# Patient Record
Sex: Male | Born: 1945 | Race: White | Hispanic: No | Marital: Married | Smoking: Never smoker
Health system: Southern US, Community
[De-identification: ages and names within clinical notes are randomized; demographics above are authoritative.]

## PROBLEM LIST (undated history)

## (undated) DIAGNOSIS — E785 Hyperlipidemia, unspecified: Secondary | ICD-10-CM

## (undated) DIAGNOSIS — K219 Gastro-esophageal reflux disease without esophagitis: Secondary | ICD-10-CM

## (undated) DIAGNOSIS — N200 Calculus of kidney: Secondary | ICD-10-CM

## (undated) DIAGNOSIS — M1990 Unspecified osteoarthritis, unspecified site: Secondary | ICD-10-CM

## (undated) DIAGNOSIS — R413 Other amnesia: Secondary | ICD-10-CM

## (undated) DIAGNOSIS — I2510 Atherosclerotic heart disease of native coronary artery without angina pectoris: Secondary | ICD-10-CM

## (undated) DIAGNOSIS — I10 Essential (primary) hypertension: Secondary | ICD-10-CM

## (undated) DIAGNOSIS — K635 Polyp of colon: Secondary | ICD-10-CM

## (undated) DIAGNOSIS — R51 Headache: Secondary | ICD-10-CM

## (undated) DIAGNOSIS — C4490 Unspecified malignant neoplasm of skin, unspecified: Secondary | ICD-10-CM

## (undated) DIAGNOSIS — N529 Male erectile dysfunction, unspecified: Secondary | ICD-10-CM

## (undated) DIAGNOSIS — R519 Headache, unspecified: Secondary | ICD-10-CM

## (undated) HISTORY — DX: Male erectile dysfunction, unspecified: N52.9

## (undated) HISTORY — PX: COLONOSCOPY: SHX174

## (undated) HISTORY — DX: Calculus of kidney: N20.0

## (undated) HISTORY — DX: Essential (primary) hypertension: I10

## (undated) HISTORY — PX: EYE SURGERY: SHX253

## (undated) HISTORY — PX: HERNIA REPAIR: SHX51

## (undated) HISTORY — DX: Unspecified osteoarthritis, unspecified site: M19.90

## (undated) HISTORY — DX: Polyp of colon: K63.5

## (undated) HISTORY — PX: CAROTID ENDARTERECTOMY: SUR193

## (undated) HISTORY — DX: Gastro-esophageal reflux disease without esophagitis: K21.9

## (undated) HISTORY — DX: Other amnesia: R41.3

## (undated) HISTORY — DX: Unspecified malignant neoplasm of skin, unspecified: C44.90

## (undated) HISTORY — PX: POLYPECTOMY: SHX149

## (undated) HISTORY — DX: Hyperlipidemia, unspecified: E78.5

## (undated) HISTORY — PX: BACK SURGERY: SHX140

## (undated) HISTORY — DX: Atherosclerotic heart disease of native coronary artery without angina pectoris: I25.10

## (undated) HISTORY — PX: CORONARY ANGIOPLASTY WITH STENT PLACEMENT: SHX49

## (undated) HISTORY — PX: TOTAL KNEE ARTHROPLASTY: SHX125

## (undated) HISTORY — PX: JOINT REPLACEMENT: SHX530

---
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
## 2003-04-11 ENCOUNTER — Encounter (INDEPENDENT_AMBULATORY_CARE_PROVIDER_SITE_OTHER): Payer: Self-pay | Admitting: *Deleted

## 2003-04-11 ENCOUNTER — Ambulatory Visit (HOSPITAL_COMMUNITY): Admission: RE | Admit: 2003-04-11 | Discharge: 2003-04-11 | Payer: Self-pay | Admitting: Gastroenterology

## 2006-12-20 ENCOUNTER — Encounter: Admission: RE | Admit: 2006-12-20 | Discharge: 2006-12-20 | Payer: Self-pay | Admitting: Internal Medicine

## 2008-06-25 ENCOUNTER — Inpatient Hospital Stay (HOSPITAL_COMMUNITY): Admission: RE | Admit: 2008-06-25 | Discharge: 2008-06-28 | Payer: Self-pay | Admitting: Orthopedic Surgery

## 2009-10-04 ENCOUNTER — Ambulatory Visit: Payer: Self-pay | Admitting: Cardiovascular Disease

## 2009-10-10 ENCOUNTER — Telehealth (INDEPENDENT_AMBULATORY_CARE_PROVIDER_SITE_OTHER): Payer: Self-pay

## 2009-10-14 ENCOUNTER — Encounter (HOSPITAL_COMMUNITY): Admission: RE | Admit: 2009-10-14 | Discharge: 2009-12-23 | Payer: Self-pay | Admitting: Cardiovascular Disease

## 2009-10-14 ENCOUNTER — Ambulatory Visit: Payer: Self-pay

## 2009-10-14 ENCOUNTER — Ambulatory Visit: Payer: Self-pay | Admitting: Internal Medicine

## 2009-10-14 ENCOUNTER — Encounter: Payer: Self-pay | Admitting: Internal Medicine

## 2009-11-12 ENCOUNTER — Ambulatory Visit: Payer: Self-pay | Admitting: Cardiovascular Disease

## 2010-02-18 NOTE — Assessment & Plan Note (Signed)
Summary: Cardiology Nuclear Testing  Nuclear Med Background Indications for Stress Test: Evaluation for Ischemia, Stent Patency   History: Myocardial Perfusion Study, Stents  History Comments: '03 Stent RCA. '06 MPS: NL.  Symptoms: Chest Pain    Nuclear Pre-Procedure Cardiac Risk Factors: Lipids Caffeine/Decaff Intake: None NPO After: 7:30 PM Lungs: clear IV 0.9% NS with Angio Cath: 22g     IV Site: R Antecubital IV Started by: Irean Hong, RN Chest Size (in) 46     Height (in): 72 Weight (lb): 222 BMI: 30.22  Nuclear Med Study 1 or 2 day study:  1 day     Stress Test Type:  Treadmill/Lexiscan Reading MD:  Dietrich Pates, MD     Referring MD:  P.Nahser Resting Radionuclide:  Technetium 28m Tetrofosmin     Resting Radionuclide Dose:  11 mCi  Stress Radionuclide:  Technetium 70m Tetrofosmin     Stress Radionuclide Dose:  33 mCi   Stress Protocol  Max Systolic BP: 171 mm Hg Lexiscan: 0.4 mg   Stress Test Technologist:  Milana Na, EMT-P     Nuclear Technologist:  Domenic Polite, CNMT  Rest Procedure  Myocardial perfusion imaging was performed at rest 45 minutes following the intravenous administration of Technetium 28m Tetrofosmin.  Stress Procedure  The patient received IV Lexiscan 0.4 mg over 15-seconds with concurrent low level exercise and then Technetium 23m Tetrofosmin was injected at 30-seconds while the patient continued walking one more minute.  There were no significant changes with Lexiscan.  Quantitative spect images were obtained after a 45 minute delay.  QPS Raw Data Images:  Soft tissue (diaphragm) underlies heart. Stress Images:  Thinning in the inferior wall (base, distal); inferolateral wall (base).  Otherwise normal perfusion. Rest Images:  No significant change from the stress images. Subtraction (SDS):  No evidence of ischemia. Transient Ischemic Dilatation:  1.0  (Normal <1.22)  Lung/Heart Ratio:  .27  (Normal <0.45)  Quantitative Gated  Spect Images QGS EDV:  113 ml QGS ESV:  39 ml QGS EF:  65 %   Overall Impression  Exercise Capacity: Lexiscan with no exercise. BP Response: Normal blood pressure response. Clinical Symptoms: No chest pain ECG Impression: No significant ST segment change suggestive of ischemia. Overall Impression: Normal stress nuclear study.

## 2010-02-18 NOTE — Progress Notes (Signed)
Summary: Nuc. Pre-Procedure  Phone Note Outgoing Call Call back at [facility] Phone [phone]   Call placed by: Irean Hong, RN,  October 10, 2009 3:05 PM Summary of Call: Reviewed information on Myoview Information Sheet (see scanned document for further details).  Spoke with patient's wife.     Nuclear Med Background Indications for Stress Test: Evaluation for Ischemia, Stent Patency   History: Myocardial Perfusion Study, Stents  History Comments: '03 Stent RCA. '06 MPS: NL.  Symptoms: Chest Pain    Nuclear Pre-Procedure Cardiac Risk Factors: Lipids

## 2010-04-02 ENCOUNTER — Ambulatory Visit (INDEPENDENT_AMBULATORY_CARE_PROVIDER_SITE_OTHER): Payer: BC Managed Care – PPO | Admitting: Cardiovascular Disease

## 2010-04-02 DIAGNOSIS — Z9861 Coronary angioplasty status: Secondary | ICD-10-CM

## 2010-04-02 DIAGNOSIS — I119 Hypertensive heart disease without heart failure: Secondary | ICD-10-CM

## 2010-04-11 ENCOUNTER — Encounter: Payer: Self-pay | Admitting: *Deleted

## 2010-04-11 ENCOUNTER — Telehealth: Payer: Self-pay | Admitting: *Deleted

## 2010-04-11 NOTE — Telephone Encounter (Signed)
Message copied by Erskine Squibb on Fri Apr 11, 2010 10:20 AM ------      Message from: Sheffield Slider      Created: Fri Apr 11, 2010  9:31 AM      Regarding: INCREASE LIPITOR      Contact: [phone]       CALL WIFE ABOUT THE LIPITOR.  DOES NOT AGREE IN INCREASE PLEASE REVIEW PREVIOUS BLOOD WORK PER WIFE.  CHART IN BOX.  932AM

## 2010-04-11 NOTE — Telephone Encounter (Signed)
WIFE CALLING STATING HER HUSBAND DOES NOT WANT TO INCREASE HIS LIPITOR TO 40MG  PER DR. Namon Cirri REQUEST ON RECENT BLOOD WORK.  STATES HE IS NOT COMFORTABLE DOING THIS.  INSTRUCTED PT TO CONTINUE LIPITOR 20MG  AND NIASPAN 1000MG  DAILY AND CONTINUE TO WATCH HIS DIET AND EXERCISE.

## 2010-04-11 NOTE — Telephone Encounter (Signed)
ERRONEOUS This encounter was created in error - please disregard. 

## 2010-04-28 LAB — SYNOVIAL CELL COUNT + DIFF, W/ CRYSTALS
Crystals, Fluid: NONE SEEN
Lymphocytes-Synovial Fld: 35 % — ABNORMAL HIGH (ref 0–20)
Monocyte-Macrophage-Synovial Fluid: 60 % (ref 50–90)
WBC, Synovial: 13 /mm3 (ref 0–200)

## 2010-04-28 LAB — CBC
HCT: 33 % — ABNORMAL LOW (ref 39.0–52.0)
HCT: 40.7 % (ref 39.0–52.0)
Hemoglobin: 10.5 g/dL — ABNORMAL LOW (ref 13.0–17.0)
Hemoglobin: 13.6 g/dL (ref 13.0–17.0)
MCHC: 33.3 g/dL (ref 30.0–36.0)
MCHC: 33.9 g/dL (ref 30.0–36.0)
MCHC: 34.2 g/dL (ref 30.0–36.0)
MCHC: 34.7 g/dL (ref 30.0–36.0)
MCV: 96.4 fL (ref 78.0–100.0)
MCV: 96.6 fL (ref 78.0–100.0)
Platelets: 153 10*3/uL (ref 150–400)
Platelets: 183 10*3/uL (ref 150–400)
RBC: 3.43 MIL/uL — ABNORMAL LOW (ref 4.22–5.81)
RBC: 3.51 MIL/uL — ABNORMAL LOW (ref 4.22–5.81)
RBC: 4.22 MIL/uL (ref 4.22–5.81)
RDW: 12.7 % (ref 11.5–15.5)
RDW: 12.8 % (ref 11.5–15.5)
RDW: 13 % (ref 11.5–15.5)
WBC: 8.2 10*3/uL (ref 4.0–10.5)
WBC: 8.9 10*3/uL (ref 4.0–10.5)

## 2010-04-28 LAB — COMPREHENSIVE METABOLIC PANEL
ALT: 34 U/L (ref 0–53)
Albumin: 3.8 g/dL (ref 3.5–5.2)
BUN: 14 mg/dL (ref 6–23)
Chloride: 106 mEq/L (ref 96–112)
Creatinine, Ser: 1.09 mg/dL (ref 0.4–1.5)
GFR calc Af Amer: >60 mL/min (ref >60)
Potassium: 4.8 mEq/L (ref 3.5–5.1)
Total Bilirubin: 0.9 mg/dL (ref 0.3–1.2)
Total Protein: 6.2 g/dL (ref 6.0–8.3)

## 2010-04-28 LAB — BASIC METABOLIC PANEL
Chloride: 103 mEq/L (ref 96–112)
Creatinine, Ser: 1.12 mg/dL (ref 0.4–1.5)
Creatinine, Ser: 1.27 mg/dL (ref 0.4–1.5)
GFR calc Af Amer: >60 mL/min (ref >60)
GFR calc non Af Amer: >60 mL/min (ref >60)
Glucose, Bld: 133 mg/dL — ABNORMAL HIGH (ref 70–99)
Potassium: 4 mEq/L (ref 3.5–5.1)
Sodium: 137 mEq/L (ref 135–145)

## 2010-04-28 LAB — PROTIME-INR
INR: 1.1 (ref 0.00–1.49)
INR: 1.6 — ABNORMAL HIGH (ref 0.00–1.49)
Prothrombin Time: 15 seconds (ref 11.6–15.2)
Prothrombin Time: 19.9 seconds — ABNORMAL HIGH (ref 11.6–15.2)
Prothrombin Time: 25.1 seconds — ABNORMAL HIGH (ref 11.6–15.2)

## 2010-04-28 LAB — URINALYSIS, ROUTINE W REFLEX MICROSCOPIC: Hgb urine dipstick: NEGATIVE

## 2010-04-28 LAB — APTT: aPTT: 27 seconds (ref 24–37)

## 2010-04-28 LAB — PATHOLOGIST SMEAR REVIEW

## 2010-04-28 LAB — TYPE AND SCREEN: ABO/RH(D): O POS

## 2010-04-28 LAB — ABO/RH: ABO/RH(D): O POS

## 2010-06-03 NOTE — Op Note (Signed)
[name], [name]                ACCOUNT NO.:  [id number]   MEDICAL RECORD NO.:  [id number]          PATIENT TYPE:  INP   LOCATION:  0005                         FACILITY:  [facility]   PHYSICIAN:  Ollen Gross, M.D.    DATE OF BIRTH:  07/25/1945   DATE OF PROCEDURE:  06/25/2008  DATE OF DISCHARGE:                               OPERATIVE REPORT   PREOPERATIVE DIAGNOSIS:  Osteoarthritis, left knee.   POSTOPERATIVE DIAGNOSIS:  Osteoarthritis, left knee.   PROCEDURE:  Left total knee arthroplasty.   SURGEON:  Ollen Gross, M.D.   ASSISTANT:  Alexzandrew L. Perkins, P.A.C.   ANESTHESIA:  General with postop Marcaine pain pump.   ESTIMATED BLOOD LOSS:  Minimal.   DRAINS:  None.   TOURNIQUET TIME:  32 minutes at 300 mmHg.   COMPLICATIONS:  None.   CONDITION:  Stable to recovery.   BRIEF CLINICAL NOTE:  [name] is a 65 year old male with end-stage  arthritis of the left knee with progressively worsening pain and  dysfunction.  He presents now for left total knee arthroplasty.   PROCEDURE IN DETAIL:  After successful administration of general  anesthetic a tourniquet was placed high on the left thigh and left lower  extremity prepped and draped in the usual sterile fashion.  The  extremity was wrapped in an Esmarch, knee flexed, tourniquet inflated to  300 mmHg.  A midline incision was made with a 10 blade through the  subcutaneous tissue to the level of the extensor mechanism.  A fresh  blade was used to make a medial parapatellar arthrotomy.  The soft  tissue on the proximal medial tibia was subperiosteally elevated to the  joint line with the knife into the semimembranosus bursa with a Cobb  elevator.  The soft tissue laterally was elevated with attention being  paid to avoid the patellar tendon on the tibial tubercle.  The patella  was subluxed laterally and knee flexed 90 degrees and the ACL and PCL  were removed.  A drill was used to create a starting hole in the  distal  femur and the canal was thoroughly irrigated.  The 5 degrees left valgus  alignment guide was placed and referencing off the posterior condyles  rotation was marked and the block pinned to remove 11 mm off the distal  femur.  The 11 mm was taken because of a preop flexion contracture.  The  distal femoral resection was made with an oscillating saw.  The sizing  block was placed and size 5 was most appropriate.  Rotation was marked  off the epicondylar axis.  The size 5 cutting block was placed and the  anterior, posterior and chamfer cuts were made.   The tibia was subluxed forward and menisci removed.  The extramedullary  tibial alignment guide was placed referencing proximally at the medial  aspect of the tibial tubercle and distally along the second metatarsal  axis and tibial crest.  The block was pinned to remove about 10 mm off  the non-deficient lateral side.  The tibial resection was made with an  oscillating saw.  The  size 5 was the most appropriate tibial component  and the proximal tibia was prepared with the modular drill and keel  punch for the size 5.  The femoral preparation was completed with the  intercondylar cut for the size 5.   The size 5 mobile bearing tibial trial, the size 5 posterior stabilized  femoral trial and the 10 mm posterior stabilized rotating platform  insert trial were placed.  With the 10, full extension was achieved with  excellent varus, valgus and anterior and posterior balance throughout  full range of motion.  The patella was everted and thickness measured to  be 27 mm.  A freehand resection was taken to 15 mm, the 41 template was  placed, lug holes were drilled, trial patella was placed and it tracks  normally.  Osteophytes were removed off the posterior femur with the  trial in place.  All trials were removed and the cut bone surfaces were  prepared with pulsatile lavage.  The cement was mixed and once ready for  implantation a size 5  mobile bearing tibial tray, a size 5 posterior  stabilized femur and 41 patella were cemented into place and the patella  was held with a clamp.  The trial 10 mm insert was placed and the knee  held in full extension and all extruded cement removed.  When the cement  was fully hardened then the permanent 10 mm posterior stabilized  rotating platform insert was placed into the tibial tray.  The wound was  copiously irrigated with saline solution and then FloSeal injected on  the posterior capsule, medial and lateral gutters and suprapatellar  area.  A moist sponge was placed and tourniquet released for a total  time of 32 minutes.  The sponge was held for 2 minutes then removed.  Minimal bleeding was encountered.  Bleeding that was encountered was  stopped with electrocautery.  The wound was then copiously irrigated  with saline solution and the arthrotomy closed with interrupted #1 PDS.  Flexion against gravity was 140 degrees.  Subcutaneous was closed with  interrupted 2-0 Vicryl and subcuticular running 4-0 Monocryl.  The  catheter for the Marcaine pain pump was placed the pump was initiated.  The incision was cleaned and dried and Steri-Strips and bulky sterile  dressing applied.  He was placed into a knee immobilizer, awakened and  transported to recovery in stable condition.      Ollen Gross, M.D.  Electronically Signed     FA/MEDQ  D:  06/25/2008  T:  06/25/2008  Job:  [id number]

## 2010-06-03 NOTE — Discharge Summary (Signed)
[name], [name]                ACCOUNT NO.:  [id number]   MEDICAL RECORD NO.:  [id number]          PATIENT TYPE:  INP   LOCATION:  1615                         FACILITY:  [facility]   PHYSICIAN:  Ollen Gross, M.D.    DATE OF BIRTH:  02-13-1945   DATE OF ADMISSION:  06/25/2008  DATE OF DISCHARGE:  06/28/2008                               DISCHARGE SUMMARY   ADMISSION DIAGNOSES:  1. Osteoarthritis of the left knee.  2. Hypertension.  3. Coronary arterial disease.  4. Hypercholesterolemia.  5. History of gout.   DISCHARGE DIAGNOSES:  1. Osteoarthritis of the left knee, status post left total knee      replacement arthroplasty.  2. Hypertension.  3. Coronary arterial disease.  4. Hypercholesterolemia.  5. History of gout.   PROCEDURE:  On June 25, 2008, left total knee.   SURGEON:  Ollen Gross, M.D.   ASSISTANT:  Alexzandrew L. Perkins, P.A.C.   ANESTHESIA:  General.   TOURNIQUET TIME:  Thirty-two minutes.   CONSULTS:  Courtesy visit, Gaspar Garbe, M.D.   BRIEF HISTORY:  [name] is a 65 year old male with end-stage  arthroscopy of the left knee, progressive worsening pain and  dysfunction, now presents for total knee arthroplasty.   LABORATORY DATA:  Preop CBC showed a hemoglobin of 13.6, hematocrit  40.7, white cell count 5.7, platelets 183, PT/INR 13.7 and 1.0, PTT 27.  Chem panel on admission all within normal limits.  Preop UA:  Cloudy,  but negative.  Serial CBCs were followed.  Hemoglobin dropped down to  11.4 and came back up to 11.6.  Last known H and H 10.5 and 30.9.  Serial pro-times followed per Coumadin protocol.  Last known PT/INR 25.1  and 2.1.  Serial BMETs were followed.  Electrolytes remained within  normal limits.   DIAGNOSTICS:  1. Two-view chest June 19, 2008:  No acute cardiopulmonary      abnormalities.  2. EKG June 19, 2008:  Sinus bradycardia, otherwise normal EKG      confirmed by Dr. Arvilla Meres.   HOSPITAL COURSE:  The  patient admitted to [facility] taken to  the OR and underwent the above stated procedure without complication.  The patient tolerated the procedure well.  Later transferred to the  recovery room on the orthopedic floor and started on PCA and p.o.  analgesics for pain control following surgery.  He was doing well on the  p.o. medications by day 1, so we discontinued the PCA.  He started  getting up out of bed.  He had excellent urinary output.  He had a  history of a gout flare earlier prior to his hospitalization, so we sent  off some fluid from his knee for crystals which was normal.  No crystals  were seen.  He actually did real well on day 1.  He walked about 170  feet.  By day 2, he was walking over 200 feet.  He had a courtesy visit  by Dr. Wylene Simmer who came by to see his patient.  He was doing well.  On  day 2,  we changed his dressing.  He had excellent urinary output.  He  was not tolerating the Percocet very well, so we switched him over to  Vicodin and he did well with that.  By day 3, he was doing well, meeting  his goals and discharged home.   DISPOSITION:  The patient was discharged home on June 28, 2008.   DISCHARGE MEDICATIONS:  1. Vicodin.  2. Robaxin.  3. Coumadin.   DIET:  Heart-healthy, low-cholesterol diet.   ACTIVITY:  He is total knee protocol.  Home health PT and home health  nursing.  Weightbearing as tolerated.  May start showering, however, do  not submerge incision under water.   FOLLOW UP:  Follow up with Dr. Lequita Halt 2 weeks from date of surgery.  Contact the office at [phone] to make appointment for follow up care at  the [facility].   CONDITION ON DISCHARGE:  Improving.      Alexzandrew L. Perkins, P.A.C.      Ollen Gross, M.D.  Electronically Signed    ALP/MEDQ  D:  06/28/2008  T:  06/28/2008  Job:  [id number]   cc:   Ollen Gross, M.D.  Fax: [phone]   Gaspar Garbe, M.D.  Fax: [phone]    Vesta Mixer, M.D.  Fax: [phone]

## 2010-06-03 NOTE — H&P (Signed)
NAMETESLA, BOCHICCHIO                ACCOUNT NO.:  [id number]   MEDICAL RECORD NO.:  [id number]          PATIENT TYPE:  INP   LOCATION:  0005                         FACILITY:  [facility]   PHYSICIAN:  Ollen Gross, M.D.    DATE OF BIRTH:  15-Jul-1945   DATE OF ADMISSION:  06/25/2008  DATE OF DISCHARGE:                              HISTORY & PHYSICAL   CHIEF COMPLAINT:  Left knee pain.   HISTORY OF THE PRESENT ILLNESS:  The patient is 65 year old male who has  seen by Dr. Lequita Halt for ongoing left knee pain that has been ongoing for  many years now and has progressively gotten worse.  He is at a point now  where he has bone-on-bone contact in the medial compartment and also  patellofemoral.  He has advanced arthritis and felt to be a good  candidate for surgery.  He has seen preoperatively by Dr. Elease Hashimoto and  also by Dr. Wylene Simmer and felt to be stable for surgery.   ALLERGIES:  NO KNOWN DRUG ALLERGIES.   CURRENT MEDICATIONS:  Celebrex, Niaspan, Toprol, simvastatin, aspirin,  multivitamin, triamterene/HCTZ, osteobiflex, Prilosec over-the-counter,  fish oil, calcium, saw-palmetto.   PAST MEDICAL HISTORY:  Hypertension, coronary arterial disease,  hypercholesterolemia, history of gout.   PAST SURGICAL HISTORY:  Back surgery, arthroscopic knee surgery, eye  surgery x2, pilonidal cyst removal and he has undergone cardiac  catheterization in 2003 which was done in [place] where he had three  stents placed.   FAMILY HISTORY:  Father with colon cancer.  Mother with Alzheimer's.   SOCIAL HISTORY:  Married, retired from [employer].  He chews tobacco two  to three times a week, occasional use of alcohol, one child, has three  steps entering his home, does have a living will, healthcare power of  attorney.   REVIEW OF SYSTEMS:  GENERAL:  No fevers, chills, night sweats.  NEUROLOGIC:  No seizures, syncope or paralysis.  RESPIRATORY:  No shortness of breath, productive cough or hemoptysis.  CARDIOVASCULAR:  No chest pain.  GI: No nausea, vomiting, diarrhea or constipation.  GU: No dysuria, hematuria or discharge.  MUSCULOSKELETAL:  Joint pain and back pain.   PHYSICAL EXAMINATION:  Vital signs:  Pulse 56, respirations 12, blood  pressure 158/100.  General: A 65 year old white male well-nourished,  well-developed, no acute distress.  He is alert and cooperative,  pleasant.  Good historian.  HEENT: Normocephalic, atraumatic.  Pupils  are equal, round and reactive.  Oropharynx clear.  EOMs intact.  Neck:  Supple.  Chest: Clear anterior posterior chest walls.  No rhonchi, rales  or wheezing.  Heart: Regular rate and rhythm.  No murmur.  Abdomen:  Soft, nontender.  Bowel sounds present.  Rectal, breasts and genitalia  not done and per present illness.  Extremities:  Left knee - no  effusion, varus malalignment or deformity.  Range of motion 5-125.  Moderate crepitus.   IMPRESSION:  Osteoarthritis, left knee.   PLAN:  The patient will be admitted to [facility] to the left  total knee replacement arthroplasty.  Surgery will be performed by Dr.  Homero Fellers Aluisio.      Alexzandrew L. Perkins, P.A.C.      Ollen Gross, M.D.  Electronically Signed    ALP/MEDQ  D:  06/24/2008  T:  06/25/2008  Job:  [id number]   cc:   Gaspar Garbe, M.D.  Fax: [phone]   Vesta Mixer, M.D.  Fax: [phone]

## 2010-06-06 NOTE — Op Note (Signed)
NAME:  DELSHAWN, [name]                          ACCOUNT NO.:  [id number]   MEDICAL RECORD NO.:  [id number]                   PATIENT TYPE:  AMB   LOCATION:  ENDO                                 FACILITY:  [facility]   PHYSICIAN:  Bernette Redbird, M.D.                DATE OF BIRTH:  Jun 05, 1945   DATE OF PROCEDURE:  04/11/2003  DATE OF DISCHARGE:                                 OPERATIVE REPORT   PROCEDURE PERFORMED:  Colonoscopy with polypectomy.   ENDOSCOPIST:  Florencia Reasons, M.D.   INDICATIONS FOR PROCEDURE:  Family history of colon cancer in his father,  who was diagnosed in his late 78s.  Colonoscopy in another city about 3-1/2  years ago apparently showed a small polyp.   FINDINGS:  Small polyp near the splenic flexure.   DESCRIPTION OF PROCEDURE:  The risks and rationale of the procedure had been  discussed with the patient, who provided written consent.  Sedation was  fentanyl  100 mcg and Versed 9 mg IV prior to and during the course of the  procedure without arrhythmias or desaturation.   The Olympus adult video colonoscope was advanced with moderate looping  around the colon to the cecum, turning the patient into the supine position  and using external abdominal compression to control looping.  Pullback was  then performed.  The quality of the prep was excellent and it is felt that  all areas were well seen.   There was a 3 to 4 mm sessile polyp in the distal transverse colon at about  60 cm from the external anal opening removed by snare technique with  complete hemostasis and no evidence of excessive cautery.  No other polyps  were seen and there was no evidence of cancer, colitis, vascular  malformations or diverticulosis.  Retroflexion in the rectum was  unremarkable.   The scope was removed from the patient, who tolerated the procedure well and  there were no apparent complications.   IMPRESSION:  1. Solitary small sessile polyp removed as described above from the  midcolon     (211.3).  2. Family history of colon cancer.  3. Prior history of small colon polyp removed in another city, histology     unknown.   PLAN:  Await pathology on the polyp.                                               Bernette Redbird, M.D.    RB/MEDQ  D:  04/11/2003  T:  04/12/2003  Job:  [id number]   cc:   Gaspar Garbe, M.D.  [address]  [place]  [place] [address]  Fax: [phone]

## 2010-09-18 ENCOUNTER — Ambulatory Visit (INDEPENDENT_AMBULATORY_CARE_PROVIDER_SITE_OTHER): Payer: BC Managed Care – PPO | Admitting: Cardiovascular Disease

## 2010-09-18 ENCOUNTER — Encounter: Payer: Self-pay | Admitting: *Deleted

## 2010-09-18 ENCOUNTER — Encounter: Payer: Self-pay | Admitting: Cardiovascular Disease

## 2010-09-18 DIAGNOSIS — I2510 Atherosclerotic heart disease of native coronary artery without angina pectoris: Secondary | ICD-10-CM

## 2010-09-18 DIAGNOSIS — I10 Essential (primary) hypertension: Secondary | ICD-10-CM | POA: Insufficient documentation

## 2010-09-18 HISTORY — DX: Atherosclerotic heart disease of native coronary artery without angina pectoris: I25.10

## 2010-09-18 MED ORDER — LOSARTAN POTASSIUM 25 MG PO TABS: 25.0000 mg | ORAL_TABLET | Freq: Every day | ORAL | Status: DC

## 2010-09-18 NOTE — Assessment & Plan Note (Addendum)
He brought in his blood pressure log. His readings are all in the 110 120 range. He's taking Micardis 40 mg on every other day basis. He still has of some occasional episodes of dizziness when he takes Micardis.  I suspect that the Micardis is actually little bit too potent for him. I think we will try losartan 25 mg a day which should be less potent and will be cheaper.  I will see him in 6 months - sooner if his BP increases.  He will continue to check it every day.

## 2010-09-18 NOTE — Progress Notes (Signed)
[name] Date of Birth  July 13, 1945 [facility] [address]     [address] [place], [place]  [address] [phone]  Fax  [phone]  History of Present Illness:  65 year old gentleman with a history of coronary artery disease. Also has a history of hypertension.  He's done very well since I last saw him 5 months ago. He denies any chest pain or shortness breath. He's had some episodes of dizziness. He decreased his Micardis to 40 mg every other day and that seems to be helping. He still has some occasional episodes of dizziness especially on the days that he takes his Micardis.  Current Outpatient Prescriptions  Medication Sig Dispense Refill  . aspirin 81 MG tablet Take 81 mg by mouth daily.        [name] atorvastatin (LIPITOR) 20 MG tablet Take 20 mg by mouth daily.        . Calcium Carb-Cholecalciferol (CALCIUM 1000 + D PO) Take 1 tablet by mouth daily.        . celecoxib (CELEBREX) 200 MG capsule Take 200 mg by mouth daily.        . Cinnamon 500 MG capsule Take 500 mg by mouth daily.        . Misc Natural Products (OSTEO BI-FLEX JOINT SHIELD) TABS Take 1 tablet by mouth daily.        . multivitamin (THERAGRAN) per tablet Take 1 tablet by mouth daily.        . niacin (NIASPAN) 1000 MG CR tablet Take 1,000 mg by mouth at bedtime.        . Omega-3 Fatty Acids (FISH OIL) 1200 MG CAPS Take 1 capsule by mouth 2 (two) times daily.        [name] omeprazole (PRILOSEC) 20 MG capsule Take 20 mg by mouth daily.        . Saw Palmetto, Serenoa repens, (SAW PALMETTO PO) Take 1 capsule by mouth daily.        [name] telmisartan (MICARDIS) 40 MG tablet Take 40 mg by mouth every other day.       . vitamin C (ASCORBIC ACID) 500 MG tablet Take 500 mg by mouth daily.           No Known Allergies  Past Medical History  Diagnosis Date  . Coronary artery disease     post PTCA and stent of the distal RCA. He has a 3.0 mm espress 2 stent deployed at 14 atmospheres  placed in [place], [place]            . Dyslipidemia   . Erectile dysfunction   . Chest pain   . Chest pain     Past Surgical History  Procedure Date  . Coronary angioplasty with stent placement     post PTCA and stent of the distal RCA. He has a 3.0 mm espress 2 stent deployed at 14 atmospheres placed in [place], [place]            . Total knee arthroplasty     left    History  Smoking status  . Never Smoker   Smokeless tobacco  . Never Used    History  Alcohol Use No    Family History  Problem Relation Age of Onset  . Aortic aneurysm Father   . Cancer Father     colon   . Coronary artery disease Mother     with CABG  . Alzheimer's disease Mother   .  Hypertension Brother     Reviw of Systems:  Reviewed in the HPI.  All other systems are negative.  Physical Exam: BP 118/66  Pulse 64  Ht 6\' 1"  (1.854 m)  Wt 208 lb (94.348 kg)  BMI 27.44 kg/m2 The patient is alert and oriented x 3.  The mood and affect are normal.   Skin: warm and dry.  Color is normal.    HEENT:   the sclera are nonicteric.  The mucous membranes are moist.  The carotids are 2+ without bruits.  There is no thyromegaly.  There is no JVD.    Lungs: clear.  The chest wall is non tender.    Heart: regular rate with a normal S1 and S2.  There are no murmurs, gallops, or rubs. The PMI is not displaced.     Abdomen: good bowel sounds.  There is no guarding or rebound.  There is no hepatosplenomegaly or tenderness.  There are no masses.   Extremities:  no clubbing, cyanosis, or edema.  The legs are without rashes.  The distal pulses are intact.   Neuro:  Cranial nerves II - XII are intact.  Motor and sensory functions are intact.    The gait is normal.   Assessment / Plan:

## 2011-03-12 ENCOUNTER — Encounter: Payer: Self-pay | Admitting: Cardiovascular Disease

## 2011-03-17 ENCOUNTER — Encounter: Payer: Self-pay | Admitting: Cardiovascular Disease

## 2011-03-17 ENCOUNTER — Ambulatory Visit (INDEPENDENT_AMBULATORY_CARE_PROVIDER_SITE_OTHER): Payer: BC Managed Care – PPO | Admitting: Cardiovascular Disease

## 2011-03-17 VITALS — BP 142/85 | HR 59 | Ht 72.0 in | Wt 215.8 lb

## 2011-03-17 DIAGNOSIS — I2510 Atherosclerotic heart disease of native coronary artery without angina pectoris: Secondary | ICD-10-CM

## 2011-03-17 DIAGNOSIS — I10 Essential (primary) hypertension: Secondary | ICD-10-CM

## 2011-03-17 DIAGNOSIS — E785 Hyperlipidemia, unspecified: Secondary | ICD-10-CM

## 2011-03-17 MED ORDER — NIACIN ER (ANTIHYPERLIPIDEMIC) 1000 MG PO TBCR: 1000.0000 mg | EXTENDED_RELEASE_TABLET | Freq: Every day | ORAL | Status: DC

## 2011-03-17 NOTE — Progress Notes (Signed)
[name] Date of Birth  03/10/1945 [facility]     [place] Office  [address]    [address]   [address] [place], [place]  [address]    [place], [place]  [address] [phone]  Fax  [phone]  [phone]  Fax [phone]  Problem list: 1. Coronary artery disease-status post PTCA and stenting of the distal right coronary artery. He has a 3.0 mm express 2 stent deployed at 14 atmospheres. This was performed in [place], [place] 2. Dyslipidemia 3. Left total knee replacement 4. Erectile dysfunction  History of Present Illness:  [name] is doing fairly well. He's not had any episodes of chest pain. He has had some orthostatic hypotension but this seems to be fairly well controlled. He's gained a little bit of weight over the winter months.  Current Outpatient Prescriptions on File Prior to Visit  Medication Sig Dispense Refill  . aspirin 81 MG tablet Take 81 mg by mouth daily.        [name] atorvastatin (LIPITOR) 20 MG tablet Take 20 mg by mouth daily.        . celecoxib (CELEBREX) 200 MG capsule Take 200 mg by mouth daily.        [name] losartan (COZAAR) 25 MG tablet Take 1 tablet (25 mg total) by mouth daily.  30 tablet  12  . Misc Natural Products (OSTEO BI-FLEX JOINT SHIELD) TABS Take 1 tablet by mouth daily.        . multivitamin (THERAGRAN) per tablet Take 1 tablet by mouth daily.        . niacin (NIASPAN) 1000 MG CR tablet Take 1,000 mg by mouth at bedtime.        . Omega-3 Fatty Acids (FISH OIL) 1200 MG CAPS Take 1 capsule by mouth 2 (two) times daily.        [name] omeprazole (PRILOSEC) 20 MG capsule Take 20 mg by mouth daily.        . Saw Palmetto, Serenoa repens, (SAW PALMETTO PO) Take 2 capsules by mouth daily.       . vitamin C (ASCORBIC ACID) 500 MG tablet Take 500 mg by mouth daily.          No Known Allergies  Past Medical History  Diagnosis Date  . Coronary artery disease     post PTCA and stent of the distal RCA. He has a 3.0 mm  espress 2 stent deployed at 14 atmospheres placed in [place], [place]            . Dyslipidemia   . Erectile dysfunction   . Chest pain   . Chest pain     Past Surgical History  Procedure Date  . Coronary angioplasty with stent placement     post PTCA and stent of the distal RCA. He has a 3.0 mm espress 2 stent deployed at 14 atmospheres placed in [place], [place]            . Total knee arthroplasty     left    History  Smoking status  . Never Smoker   Smokeless tobacco  . Never Used    History  Alcohol Use No    Family History  Problem Relation Age of Onset  . Aortic aneurysm Father   . Cancer Father     colon   . Coronary artery disease Mother     with CABG  . Alzheimer's disease Mother   . Hypertension Brother  Reviw of Systems:  Reviewed in the HPI.  All other systems are negative.  Physical Exam: Blood pressure 142/85, pulse 59, height 6' (1.829 m), weight 215 lb 12.8 oz (97.886 kg). General: Well developed, well nourished, in no acute distress.  Head: Normocephalic, atraumatic, sclera non-icteric, mucus membranes are moist,   Neck: Supple. Carotids are 2 + without bruits. No JVD  Lungs: Clear bilaterally to auscultation.  Heart: regular rate  With normal  S1 S2. No murmurs, gallops or rubs.  Abdomen: Soft, non-tender, non-distended with normal bowel sounds. No hepatomegaly. No rebound/guarding. No masses.  Msk:  Strength and tone are normal  Extremities: No clubbing or cyanosis. No edema.  Distal pedal pulses are 2+ and equal bilaterally.  Neuro: Alert and oriented X 3. Moves all extremities spontaneously.  Psych:  Responds to questions appropriately with a normal affect.  ECG: NSR, no ST or T wave changes  Assessment / Plan:

## 2011-03-17 NOTE — Patient Instructions (Signed)
Your physician wants you to follow-up in: 6 months  You will receive a reminder letter in the mail two months in advance. If you don't receive a letter, please call our office to schedule the follow-up appointment.   Your physician recommends that you return for a FASTING lipid profile: 6 month app.

## 2011-03-17 NOTE — Assessment & Plan Note (Signed)
[name] seems to be doing very well. He's not had any episodes of chest pain or shortness breath. He does have some occasional episodes of orthostatic hypotension.  We'll continue the same medications. He had his fasting lipids checked at his medical doctors office a week or so ago. I'll see him again in 6 months. We'll check fasting labs at that time.

## 2011-10-19 ENCOUNTER — Telehealth: Payer: Self-pay | Admitting: Cardiovascular Disease

## 2011-10-19 NOTE — Telephone Encounter (Signed)
Per last office visit with Dr. Melburn Popper in February 2013, pt is still on Losartan 25mg  daily.  [facility] [address]

## 2011-10-19 NOTE — Telephone Encounter (Signed)
losartin refill requested marked off med list, is he still to take

## 2011-11-09 ENCOUNTER — Encounter: Payer: Self-pay | Admitting: Cardiovascular Disease

## 2011-11-09 ENCOUNTER — Ambulatory Visit (INDEPENDENT_AMBULATORY_CARE_PROVIDER_SITE_OTHER): Payer: BC Managed Care – PPO | Admitting: Cardiovascular Disease

## 2011-11-09 VITALS — BP 138/84 | HR 58 | Ht 72.0 in | Wt 205.0 lb

## 2011-11-09 DIAGNOSIS — E785 Hyperlipidemia, unspecified: Secondary | ICD-10-CM

## 2011-11-09 DIAGNOSIS — I2510 Atherosclerotic heart disease of native coronary artery without angina pectoris: Secondary | ICD-10-CM

## 2011-11-09 DIAGNOSIS — R001 Bradycardia, unspecified: Secondary | ICD-10-CM | POA: Insufficient documentation

## 2011-11-09 DIAGNOSIS — I498 Other specified cardiac arrhythmias: Secondary | ICD-10-CM

## 2011-11-09 NOTE — Patient Instructions (Addendum)
Your physician wants you to follow-up in: 6 months  You will receive a reminder letter in the mail two months in advance. If you don't receive a letter, please call our office to schedule the follow-up appointment.  Your physician recommends that you continue on your current medications as directed. Please refer to the Current Medication list given to you today.   Please bring a copy of your last fasting labs

## 2011-11-09 NOTE — Assessment & Plan Note (Signed)
His baseline heart rate is  fairly slow. We performed a one minute step test.He was able to  Increase his heart rate from 58 up to 85-90.   He has no symptoms.  He mentioned pacemaker some day but I told him that there was nothing he could do to prevent that from occurring.  We'll continue to monitor him closely.

## 2011-11-09 NOTE — Progress Notes (Signed)
[name] Date of Birth  03/26/1945 [facility]     [place] Office  [address]    [address]   [address] [place], [place]  [address]    [place], [place]  [address] [phone]  Fax  [phone]  [phone]  Fax [phone]  Problem list: 1. Coronary artery disease-status post PTCA and stenting of the distal right coronary artery. He has a 3.0 mm express 2 stent deployed at 14 atmospheres. This was performed in [place], [place] 2. Dyslipidemia 3. Left total knee replacement 4. Erectile dysfunction  History of Present Illness:  [name] is doing fairly well. He's not had any episodes of chest pain. He has had some orthostatic hypotension but this seems to be fairly well controlled. He's gained a little bit of weight over the winter months.  [name]  has done well since I last saw him. He continues to have a couple of episodes of orthostatic hypotension.  He's never had episodes of syncope.  He was recently seen by his general medical Dr. He had an LDL particle number  Measured but we do not have the results available today.  He denies any chest pain or dyspnea.  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  Current Outpatient Prescriptions on File Prior to Visit  Medication Sig Dispense Refill  . aspirin 81 MG tablet Take 81 mg by mouth daily.        [name] atorvastatin (LIPITOR) 20 MG tablet Take 20 mg by mouth daily.        . celecoxib (CELEBREX) 200 MG capsule Take 200 mg by mouth daily.        . Misc Natural Products (OSTEO BI-FLEX JOINT SHIELD) TABS Take 1 tablet by mouth daily.        . multivitamin (THERAGRAN) per tablet Take 1 tablet by mouth daily.        . niacin (NIASPAN) 1000 MG CR tablet Take 1 tablet (1,000 mg total) by mouth at bedtime.  90 tablet  3  . Omega-3 Fatty Acids (FISH OIL) 1200 MG CAPS Take 1 capsule by mouth 2 (two) times daily.        [name] omeprazole (PRILOSEC) 20 MG capsule Take 20 mg by mouth daily.         . Saw Palmetto, Serenoa repens, (SAW PALMETTO PO) Take 2 capsules by mouth daily.       . vitamin C (ASCORBIC ACID) 500 MG tablet Take 500 mg by mouth daily.          No Known Allergies  Past Medical History  Diagnosis Date  . Coronary artery disease     post PTCA and stent of the distal RCA. He has a 3.0 mm espress 2 stent deployed at 14 atmospheres placed in [place], [place]            . Dyslipidemia   . Erectile dysfunction   . Chest pain   . Chest pain     Past Surgical History  Procedure Date  . Coronary angioplasty with stent placement     post PTCA and stent of the distal RCA. He has a 3.0 mm espress 2 stent deployed at 14 atmospheres placed in [place], [place]            . Total knee arthroplasty     left    History  Smoking status  . Never Smoker   Smokeless tobacco  . Never  Used    History  Alcohol Use No    Family History  Problem Relation Age of Onset  . Aortic aneurysm Father   . Cancer Father     colon   . Coronary artery disease Mother     with CABG  . Alzheimer's disease Mother   . Hypertension Brother     Reviw of Systems:  Reviewed in the HPI.  All other systems are negative.  Physical Exam: Blood pressure 138/84, pulse 58, height 6' (1.829 m), weight 205 lb (92.987 kg), SpO2 99.00%. General: Well developed, well nourished, in no acute distress.  Head: Normocephalic, atraumatic, sclera non-icteric, mucus membranes are moist,   Neck: Supple. Carotids are 2 + without bruits. No JVD  Lungs: Clear bilaterally to auscultation.  Heart: regular rate  With normal  S1 S2. No murmurs, gallops or rubs.  Abdomen: Soft, non-tender, non-distended with normal bowel sounds. No hepatomegaly. No rebound/guarding. No masses.  Msk:  Strength and tone are normal  Extremities: No clubbing or cyanosis. No edema.  Distal pedal pulses are 2+ and equal bilaterally.  Neuro: Alert and oriented X 3. Moves all extremities spontaneously.  Psych:  Responds  to questions appropriately with a normal affect.  ECG:  Assessment / Plan:

## 2011-11-09 NOTE — Assessment & Plan Note (Signed)
And he has done well since his stenting 10 years ago. He's not had any episodes of chest pain or shortness of breath. We'll continue the same medications.  He apparently had subcostal levels and an LDL particle number measured at his medical doctors office. He was given a recommendation to increase his Lipitor. He wanted to check with me about that before he raise the dose. I've asked him to fax those results. If his LDL particle number as above 1000 we will certainly want to increase his atorvastatin 40 mg a day.

## 2011-11-11 ENCOUNTER — Encounter: Payer: Self-pay | Admitting: Cardiovascular Disease

## 2012-02-18 ENCOUNTER — Other Ambulatory Visit: Payer: Self-pay | Admitting: *Deleted

## 2012-02-18 DIAGNOSIS — I2510 Atherosclerotic heart disease of native coronary artery without angina pectoris: Secondary | ICD-10-CM

## 2012-02-18 DIAGNOSIS — E785 Hyperlipidemia, unspecified: Secondary | ICD-10-CM

## 2012-02-18 MED ORDER — NIACIN ER (ANTIHYPERLIPIDEMIC) 1000 MG PO TBCR: 1000.0000 mg | EXTENDED_RELEASE_TABLET | Freq: Every day | ORAL | Status: DC

## 2012-02-18 NOTE — Telephone Encounter (Signed)
Fax Received. Refill Completed. Raelyn Racette Chowoe (R.M.A)   

## 2012-04-28 ENCOUNTER — Telehealth: Payer: Self-pay | Admitting: Cardiovascular Disease

## 2012-04-28 MED ORDER — LOSARTAN POTASSIUM 25 MG PO TABS: 25.0000 mg | ORAL_TABLET | Freq: Every day | ORAL | Status: DC

## 2012-04-28 NOTE — Telephone Encounter (Signed)
Fax Received. Refill Completed. [name] (R.M.A)   

## 2012-04-28 NOTE — Telephone Encounter (Signed)
PT'S WIFE CALLING RE RX FOR LOSARTIN, [facility] AND [facility] BEFORE [facility] CAN [facility]FI[facility] DUE TO OUR TURN AROUND TIME , NEEDS ASAP,   PLS CALL IF ANY PROBLEM [facility]

## 2012-05-17 ENCOUNTER — Ambulatory Visit (INDEPENDENT_AMBULATORY_CARE_PROVIDER_SITE_OTHER): Payer: BC Managed Care – PPO | Admitting: Cardiovascular Disease

## 2012-05-17 ENCOUNTER — Encounter: Payer: Self-pay | Admitting: Cardiovascular Disease

## 2012-05-17 VITALS — BP 140/84 | HR 62 | Ht 72.0 in | Wt 204.8 lb

## 2012-05-17 DIAGNOSIS — Z8249 Family history of ischemic heart disease and other diseases of the circulatory system: Secondary | ICD-10-CM

## 2012-05-17 DIAGNOSIS — R1900 Intra-abdominal and pelvic swelling, mass and lump, unspecified site: Secondary | ICD-10-CM

## 2012-05-17 DIAGNOSIS — E785 Hyperlipidemia, unspecified: Secondary | ICD-10-CM

## 2012-05-17 DIAGNOSIS — I2510 Atherosclerotic heart disease of native coronary artery without angina pectoris: Secondary | ICD-10-CM

## 2012-05-17 MED ORDER — LOSARTAN POTASSIUM 25 MG PO TABS: 25.0000 mg | ORAL_TABLET | Freq: Every day | ORAL | Status: DC

## 2012-05-17 NOTE — Assessment & Plan Note (Signed)
He has a pulsatile abdominal mass. His father died of an abdominal aortic aneurysm. It will be important for [place] to evaluate this further. We'll order an abdominal duplex scan to rule out abdominal aortic aneurysm.

## 2012-05-17 NOTE — Progress Notes (Signed)
[name] Date of Birth  04-03-1945 [facility]     [place] Office  [address]    [address]   [address] [place], [place]  [address]    [place], [place]  [address] [phone]  Fax  [phone]  [phone]  Fax [phone]  Problem list: 1. Coronary artery disease-status post PTCA and stenting of the distal right coronary artery. He has a 3.0 mm express 2 stent deployed at 14 atmospheres. This was performed in [place], [place] 2. Dyslipidemia 3. Left total knee replacement 4. Erectile dysfunction  History of Present Illness:  [name] is doing fairly well. He's not had any episodes of chest pain. He has had some orthostatic hypotension but this seems to be fairly well controlled. He's gained a little bit of weight over the winter months.  [name]  has done well since I last saw him. He continues to have a couple of episodes of orthostatic hypotension.  He's never had episodes of syncope.  He was recently seen by his general medical Dr. He had an LDL particle number  Measured but we do not have the results available today.  He denies any chest pain or dyspnea.  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  May 17, 2012:  [name] is doing well .  He has had labwork through Dr. Deneen Harts office.  His last LDL is 75 .  Dr. Wylene Simmer has suggested the Lipator / Zetia combination ( Liptruzet) his wife states that his memory has been getting a little worse. I suspect that Dr. Wylene Simmer  is trying to limit the Lipator dose and replace some of the atorvastatin with Zetia.    Current Outpatient Prescriptions on File Prior to Visit  Medication Sig Dispense Refill  . aspirin 81 MG tablet Take 81 mg by mouth daily.        [name] atorvastatin (LIPITOR) 20 MG tablet Take 20 mg by mouth daily.        . celecoxib (CELEBREX) 200 MG capsule Take 200 mg by mouth daily.        . Misc Natural Products (OSTEO BI-FLEX JOINT SHIELD) TABS Take 1 tablet  by mouth daily.        . multivitamin (THERAGRAN) per tablet Take 1 tablet by mouth daily.        . niacin (NIASPAN) 1000 MG CR tablet Take 1 tablet (1,000 mg total) by mouth at bedtime.  90 tablet  3  . Omega-3 Fatty Acids (FISH OIL) 1200 MG CAPS Take 1 capsule by mouth 2 (two) times daily.        [name] omeprazole (PRILOSEC) 20 MG capsule Take 20 mg by mouth daily.        . Saw Palmetto, Serenoa repens, (SAW PALMETTO PO) Take 2 capsules by mouth daily.       . vitamin C (ASCORBIC ACID) 500 MG tablet Take 500 mg by mouth daily.        [name] losartan (COZAAR) 25 MG tablet Take 1 tablet (25 mg total) by mouth daily.  30 tablet  5   No current facility-administered medications on file prior to visit.    No Known Allergies  Past Medical History  Diagnosis Date  . Coronary artery disease     post PTCA and stent of the distal RCA. He has a 3.0 mm espress 2 stent deployed at 14 atmospheres placed in [place], [place]            .  Dyslipidemia   . Erectile dysfunction   . Chest pain   . Chest pain     Past Surgical History  Procedure Laterality Date  . Coronary angioplasty with stent placement      post PTCA and stent of the distal RCA. He has a 3.0 mm espress 2 stent deployed at 14 atmospheres placed in [place], [place]            . Total knee arthroplasty      left    History  Smoking status  . Never Smoker   Smokeless tobacco  . Never Used    History  Alcohol Use No    Family History  Problem Relation Age of Onset  . Aortic aneurysm Father   . Cancer Father     colon   . Coronary artery disease Mother     with CABG  . Alzheimer's disease Mother   . Hypertension Brother     Reviw of Systems:  Reviewed in the HPI.  All other systems are negative.  Physical Exam: Blood pressure 140/84, pulse 62, height 6' (1.829 m), weight 204 lb 12.8 oz (92.897 kg). General: Well developed, well nourished, in no acute distress.  Head: Normocephalic, atraumatic, sclera non-icteric, mucus  membranes are moist,   Neck: Supple. Carotids are 2 + without bruits. No JVD  Lungs: Clear bilaterally to auscultation.  Heart: regular rate  With normal  S1 S2. No murmurs, gallops or rubs.  Abdomen: Soft, non-tender, non-distended with normal bowel sounds. No hepatomegaly. No rebound/guarding. No masses.  Msk:  Strength and tone are normal  Extremities: No clubbing or cyanosis. No edema.  Distal pedal pulses are 2+ and equal bilaterally.  Neuro: Alert and oriented X 3. Moves all extremities spontaneously.  Psych:  Responds to questions appropriately with a normal affect.  ECG: 05/17/2012: Normal sinus rhythm at 62 beats a minute. He has no ST or T wave changes. Assessment / Plan:

## 2012-05-17 NOTE — Patient Instructions (Addendum)
Schedule abdominal ultra sound to evaluate for a abdominal aortic aneurysm    Your physician wants you to follow-up in: 6 monthsl receive a reminder letter in the mail two months in advance. If you don't receive a letter, please call our office to schedule the follow-up appointment.[name]

## 2012-05-17 NOTE — Assessment & Plan Note (Signed)
His most recent LDL is 75. This is quite acceptable. It sounds like his medical doctor is trying to minus his exposure to atorvastatin and also start Zetia.  This may be for memory improvement.

## 2012-05-19 ENCOUNTER — Other Ambulatory Visit: Payer: Self-pay | Admitting: Cardiovascular Disease

## 2012-05-19 ENCOUNTER — Ambulatory Visit (HOSPITAL_COMMUNITY)
Admission: RE | Admit: 2012-05-19 | Discharge: 2012-05-19 | Disposition: A | Discharge status: Home / Self Care | Payer: BC Managed Care – PPO | Source: Ambulatory Visit | Attending: Cardiovascular Disease | Admitting: Cardiovascular Disease

## 2012-05-19 DIAGNOSIS — Z8249 Family history of ischemic heart disease and other diseases of the circulatory system: Secondary | ICD-10-CM

## 2012-05-19 DIAGNOSIS — I700 Atherosclerosis of aorta: Secondary | ICD-10-CM | POA: Insufficient documentation

## 2012-05-19 DIAGNOSIS — I77811 Abdominal aortic ectasia: Secondary | ICD-10-CM | POA: Insufficient documentation

## 2012-05-20 ENCOUNTER — Telehealth: Payer: Self-pay | Admitting: Cardiovascular Disease

## 2012-05-20 NOTE — Telephone Encounter (Signed)
New problem    Pt wants to know about results-informed pts wife that dr Elease Hashimoto is not in the office today

## 2012-05-20 NOTE — Telephone Encounter (Signed)
Results given.

## 2012-11-24 ENCOUNTER — Ambulatory Visit (INDEPENDENT_AMBULATORY_CARE_PROVIDER_SITE_OTHER): Payer: Medicare Other | Admitting: Cardiovascular Disease

## 2012-11-24 ENCOUNTER — Encounter: Payer: Self-pay | Admitting: Cardiovascular Disease

## 2012-11-24 ENCOUNTER — Other Ambulatory Visit: Payer: Self-pay

## 2012-11-24 VITALS — BP 138/82 | HR 54 | Ht 72.0 in | Wt 209.0 lb

## 2012-11-24 DIAGNOSIS — I10 Essential (primary) hypertension: Secondary | ICD-10-CM

## 2012-11-24 DIAGNOSIS — E785 Hyperlipidemia, unspecified: Secondary | ICD-10-CM

## 2012-11-24 DIAGNOSIS — I2510 Atherosclerotic heart disease of native coronary artery without angina pectoris: Secondary | ICD-10-CM

## 2012-11-24 LAB — HEPATIC FUNCTION PANEL: Albumin: 4.3 g/dL (ref 3.5–5.2)

## 2012-11-24 LAB — BASIC METABOLIC PANEL
CO2: 30 mEq/L (ref 19–32)
Chloride: 103 mEq/L (ref 96–112)
Sodium: 138 mEq/L (ref 135–145)

## 2012-11-24 LAB — LIPID PANEL
HDL: 36.2 mg/dL — ABNORMAL LOW (ref >39.00)
LDL Cholesterol: 131 mg/dL — ABNORMAL HIGH (ref 0–99)
Total CHOL/HDL Ratio: 5
Triglycerides: 63 mg/dL (ref 0.0–149.0)

## 2012-11-24 NOTE — Assessment & Plan Note (Signed)
[name] is doing well. Not had any further episodes of angina. We'll continue with the same medications.  He has a history of hyperlipidemia but he did not tolerate atorvastatin. It was causing him to have loss of memory. He feels much better off to atorvastatin.  Will check fasting lipids today. It is closer levels are elevated then we will try him on Crestor.

## 2012-11-24 NOTE — Patient Instructions (Signed)
Your physician recommends that you return for a FASTING lipid profile: today   Your physician wants you to follow-up in: 6 months  You will receive a reminder letter in the mail two months in advance. If you don't receive a letter, please call our office to schedule the follow-up appointment.   

## 2012-11-24 NOTE — Assessment & Plan Note (Signed)
Has not tolerated Lipitor in the past. His lipid levels are still elevated, will try him on Crestor.

## 2012-11-24 NOTE — Progress Notes (Signed)
[name] Date of Birth  Feb 23, 1945 [facility]     [place] Office  [address]    [address]   [address] [place], [place]  [address]    [place], [place]  [address] [phone]  Fax  [phone]  [phone]  Fax [phone]  Problem list: 1. Coronary artery disease-status post PTCA and stenting of the distal right coronary artery. He has a 3.0 mm express 2 stent deployed at 14 atmospheres. This was performed in [place], [place] 2. Dyslipidemia 3. Left total knee replacement 4. Erectile dysfunction  History of Present Illness:  [name] is doing fairly well. He's not had any episodes of chest pain. He has had some orthostatic hypotension but this seems to be fairly well controlled. He's gained a little bit of weight over the winter months.  [name]  has done well since I last saw him. He continues to have a couple of episodes of orthostatic hypotension.  He's never had episodes of syncope.  He was recently seen by his general medical Dr. He had an LDL particle number  Measured but we do not have the results available today.  He denies any chest pain or dyspnea.  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  May 17, 2012:  [name] is doing well .  He has had labwork through Dr. Deneen Harts office.  His last LDL is 75 .  Dr. Wylene Simmer has suggested the Lipator / Zetia combination ( Liptruzet) his wife states that his memory has been getting a little worse. I suspect that Dr. Wylene Simmer  is trying to limit the Lipator dose and replace some of the atorvastatin with Zetia.    Nov. 6, 2014:  [name] is doing well.  No CP .   He was getting confused and he stopped his lipitor.   Last LDL is 75.  lipomed profile LDL particle number is 1153 (on lipitor)  He is feeling much better off the lipitor.    Current Outpatient Prescriptions on File Prior to Visit  Medication Sig Dispense Refill  . aspirin 81 MG tablet Take 81 mg by mouth  daily.        . celecoxib (CELEBREX) 200 MG capsule Take 200 mg by mouth daily.        . Misc Natural Products (OSTEO BI-FLEX JOINT SHIELD) TABS Take 1 tablet by mouth daily.        . multivitamin (THERAGRAN) per tablet Take 1 tablet by mouth daily.        . niacin (NIASPAN) 1000 MG CR tablet Take 1 tablet (1,000 mg total) by mouth at bedtime.  90 tablet  3  . Omega-3 Fatty Acids (FISH OIL) 1200 MG CAPS Take 1 capsule by mouth 2 (two) times daily.        [name] omeprazole (PRILOSEC) 20 MG capsule Take 20 mg by mouth daily.        . Saw Palmetto, Serenoa repens, (SAW PALMETTO PO) Take 2 capsules by mouth daily.       . vitamin C (ASCORBIC ACID) 500 MG tablet Take 500 mg by mouth daily.         No current facility-administered medications on file prior to visit.    No Known Allergies  Past Medical History  Diagnosis Date  . Coronary artery disease     post PTCA and stent of the distal RCA. He has a 3.0 mm espress 2 stent deployed at  14 atmospheres placed in [place], [place]            . Dyslipidemia   . Erectile dysfunction   . Chest pain   . Chest pain     Past Surgical History  Procedure Laterality Date  . Coronary angioplasty with stent placement      post PTCA and stent of the distal RCA. He has a 3.0 mm espress 2 stent deployed at 14 atmospheres placed in [place], [place]            . Total knee arthroplasty      left    History  Smoking status  . Never Smoker   Smokeless tobacco  . Never Used    History  Alcohol Use No    Family History  Problem Relation Age of Onset  . Aortic aneurysm Father   . Cancer Father     colon   . Coronary artery disease Mother     with CABG  . Alzheimer's disease Mother   . Hypertension Brother     Reviw of Systems:  Reviewed in the HPI.  All other systems are negative.  Physical Exam: Blood pressure 138/82, pulse 54, height 6' (1.829 m), weight 209 lb (94.802 kg). General: Well developed, well nourished, in no acute  distress.  Head: Normocephalic, atraumatic, sclera non-icteric, mucus membranes are moist,   Neck: Supple. Carotids are 2 + without bruits. No JVD  Lungs: Clear bilaterally to auscultation.  Heart: regular rate  With normal  S1 S2. No murmurs, gallops or rubs.  Abdomen: Soft, non-tender, non-distended with normal bowel sounds. No hepatomegaly. No rebound/guarding. No masses.  Msk:  Strength and tone are normal  Extremities: No clubbing or cyanosis. No edema.  Distal pedal pulses are 2+ and equal bilaterally.  Neuro: Alert and oriented X 3. Moves all extremities spontaneously.  Psych:  Responds to questions appropriately with a normal affect.  ECG: 05/17/2012: Normal sinus rhythm at 62 beats a minute. He has no ST or T wave changes. Assessment / Plan:

## 2012-11-29 ENCOUNTER — Telehealth: Payer: Self-pay | Admitting: *Deleted

## 2012-11-29 DIAGNOSIS — E785 Hyperlipidemia, unspecified: Secondary | ICD-10-CM

## 2012-11-29 MED ORDER — ROSUVASTATIN CALCIUM 10 MG PO TABS: 10.0000 mg | ORAL_TABLET | Freq: Every day | ORAL | Status: DC

## 2012-11-29 NOTE — Telephone Encounter (Signed)
Message copied by Antony Odea on Tue Nov 29, 2012  1:05 PM ------      Message from: Vesta Mixer      Created: Mon Nov 28, 2012  5:34 PM       He did not tolerate lipitor.  LDL is 131.      Ask him to try Crestor 10 mg a day.      Recheck lipids, liver, BMP in 3 months.       ------

## 2012-11-29 NOTE — Telephone Encounter (Signed)
Reviewed labs/ pt will start med/ 3 mo lab date given.

## 2013-01-19 DIAGNOSIS — C4490 Unspecified malignant neoplasm of skin, unspecified: Secondary | ICD-10-CM

## 2013-01-19 HISTORY — DX: Unspecified malignant neoplasm of skin, unspecified: C44.90

## 2013-02-21 ENCOUNTER — Other Ambulatory Visit: Payer: Self-pay | Admitting: Cardiovascular Disease

## 2013-03-01 ENCOUNTER — Other Ambulatory Visit: Payer: Medicare Other

## 2013-03-05 ENCOUNTER — Other Ambulatory Visit: Payer: Self-pay | Admitting: Cardiovascular Disease

## 2013-03-10 ENCOUNTER — Other Ambulatory Visit: Payer: Medicare Other

## 2013-03-14 ENCOUNTER — Encounter: Payer: Self-pay | Admitting: Cardiovascular Disease

## 2013-03-24 ENCOUNTER — Other Ambulatory Visit (INDEPENDENT_AMBULATORY_CARE_PROVIDER_SITE_OTHER): Payer: Medicare Other

## 2013-03-24 DIAGNOSIS — E785 Hyperlipidemia, unspecified: Secondary | ICD-10-CM

## 2013-03-24 LAB — BASIC METABOLIC PANEL
BUN: 19 mg/dL (ref 6–23)
CO2: 30 mEq/L (ref 19–32)
CREATININE: 1.2 mg/dL (ref 0.4–1.5)
Calcium: 9.3 mg/dL (ref 8.4–10.5)
Chloride: 104 mEq/L (ref 96–112)
GFR: 67.75 mL/min (ref >60.00)
GLUCOSE: 99 mg/dL (ref 70–99)
POTASSIUM: 4.6 meq/L (ref 3.5–5.1)
Sodium: 142 mEq/L (ref 135–145)

## 2013-03-24 LAB — LIPID PANEL
CHOLESTEROL: 152 mg/dL (ref 0–200)
HDL: 35.9 mg/dL — ABNORMAL LOW (ref >39.00)
LDL CALC: 106 mg/dL — AB (ref 0–99)
Total CHOL/HDL Ratio: 4
Triglycerides: 49 mg/dL (ref 0.0–149.0)
VLDL: 9.8 mg/dL (ref 0.0–40.0)

## 2013-03-24 LAB — HEPATIC FUNCTION PANEL
ALK PHOS: 74 U/L (ref 39–117)
ALT: 25 U/L (ref 0–53)
AST: 37 U/L (ref 0–37)
Albumin: 4.1 g/dL (ref 3.5–5.2)
BILIRUBIN DIRECT: 0.1 mg/dL (ref 0.0–0.3)
BILIRUBIN TOTAL: 0.9 mg/dL (ref 0.3–1.2)
Total Protein: 6.5 g/dL (ref 6.0–8.3)

## 2013-05-22 ENCOUNTER — Other Ambulatory Visit: Payer: Self-pay | Admitting: Cardiovascular Disease

## 2013-06-20 ENCOUNTER — Encounter: Payer: Self-pay | Admitting: Cardiovascular Disease

## 2013-06-20 ENCOUNTER — Other Ambulatory Visit: Payer: Medicare Other

## 2013-06-20 ENCOUNTER — Ambulatory Visit (INDEPENDENT_AMBULATORY_CARE_PROVIDER_SITE_OTHER): Payer: Medicare Other | Admitting: Cardiovascular Disease

## 2013-06-20 VITALS — BP 120/90 | HR 51 | Ht 72.0 in | Wt 203.0 lb

## 2013-06-20 DIAGNOSIS — I10 Essential (primary) hypertension: Secondary | ICD-10-CM

## 2013-06-20 DIAGNOSIS — I2510 Atherosclerotic heart disease of native coronary artery without angina pectoris: Secondary | ICD-10-CM

## 2013-06-20 DIAGNOSIS — E785 Hyperlipidemia, unspecified: Secondary | ICD-10-CM

## 2013-06-20 LAB — BASIC METABOLIC PANEL
BUN: 13 mg/dL (ref 6–23)
CHLORIDE: 103 meq/L (ref 96–112)
CO2: 29 meq/L (ref 19–32)
Calcium: 9.5 mg/dL (ref 8.4–10.5)
Creatinine, Ser: 1.1 mg/dL (ref 0.4–1.5)
GFR: 72.02 mL/min (ref >60.00)
GLUCOSE: 95 mg/dL (ref 70–99)
Potassium: 4.1 mEq/L (ref 3.5–5.1)
SODIUM: 139 meq/L (ref 135–145)

## 2013-06-20 LAB — HEPATIC FUNCTION PANEL
ALBUMIN: 4.3 g/dL (ref 3.5–5.2)
ALT: 27 U/L (ref 0–53)
AST: 33 U/L (ref 0–37)
Alkaline Phosphatase: 69 U/L (ref 39–117)
BILIRUBIN TOTAL: 1 mg/dL (ref 0.2–1.2)
Bilirubin, Direct: 0.1 mg/dL (ref 0.0–0.3)
Total Protein: 6.8 g/dL (ref 6.0–8.3)

## 2013-06-20 LAB — LIPID PANEL
CHOL/HDL RATIO: 4
Cholesterol: 171 mg/dL (ref 0–200)
HDL: 38.9 mg/dL — AB (ref >39.00)
LDL CALC: 118 mg/dL — AB (ref 0–99)
TRIGLYCERIDES: 70 mg/dL (ref 0.0–149.0)
VLDL: 14 mg/dL (ref 0.0–40.0)

## 2013-06-20 NOTE — Patient Instructions (Signed)
Your physician recommends that you have lab work:  TODAY  Your physician recommends that you continue on your current medications as directed. Please refer to the Current Medication list given to you today.  Your physician recommends that you schedule a follow-up appointment as needed with Dr. Acie Fredrickson

## 2013-06-20 NOTE — Assessment & Plan Note (Addendum)
[name] is doing well. Not had any episodes of chest pain or shortness breath. To be moving back to [place]. He will resume care with his cardiologist in [place]. I've been seen for the past 10 or 15 years. I'll see him on an as-needed basis if he needs care when he sitting [place] visiting his daughter.

## 2013-06-20 NOTE — Assessment & Plan Note (Signed)
Continue same medications. He'll followup with his cardiologist in [place].

## 2013-06-20 NOTE — Progress Notes (Signed)
[name] Date of Birth  13-Jun-1945 [place] [address]    [place], [place]  [id number]    [place], [place]  [address] [phone]  Fax  [phone]  [phone]  Fax [phone]  Problem list: 1. Coronary artery disease-status post PTCA and stenting of the distal right coronary artery. He has a 3.0 mm express 2 stent deployed at 14 atmospheres. This was performed in [place], [place] 2. Dyslipidemia 3. Left total knee replacement 4. Erectile dysfunction  History of Present Illness:  [name] is doing fairly well. He's not had any episodes of chest pain. He has had some orthostatic hypotension but this seems to be fairly well controlled. He's gained a little bit of weight over the winter months.  [name]  has done well since I last saw him. He continues to have a couple of episodes of orthostatic hypotension.  He's never had episodes of syncope.  He was recently seen by his general medical Dr. He had an LDL particle number  Measured but we do not have the results available today.  He denies any chest pain or dyspnea.  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  May 17, 2012:  [name] is doing well .  He has had labwork through Dr. Loren Racer office.  His last LDL is 75 .  Dr. Osborne Casco has suggested the Lipator / Zetia combination ( Liptruzet) his wife states that his memory has been getting a little worse. I suspect that Dr. Osborne Casco  is trying to limit the Lipator dose and replace some of the atorvastatin with Zetia.    Nov. 6, 2014:  [name] is doing well.  No CP .   He was getting confused and he stopped his lipitor.   Last LDL is 75.  lipomed profile LDL particle number is 1153 (on lipitor)  He is feeling much better off the lipitor.    June 20, 2013;  BP is elevated  - has been OK.   Living in [place].    Memory is getting worse.  He is off statins because of that.      Current Outpatient Prescriptions on File Prior to Visit  Medication Sig Dispense Refill  . aspirin 81 MG tablet Take 81 mg by mouth daily.        . celecoxib (CELEBREX) 200 MG capsule Take 200 mg by mouth daily.        [name] losartan (COZAAR) 25 MG tablet Take 25 mg by mouth daily. Only takes 12 mg      . Misc Natural Products (OSTEO BI-FLEX JOINT SHIELD) TABS Take 1 tablet by mouth daily.        . multivitamin (THERAGRAN) per tablet Take 1 tablet by mouth daily.        . niacin (NIASPAN) 1000 MG CR tablet TAKE 1 TABLET AT BEDTIME  90 tablet  3  . Omega-3 Fatty Acids (FISH OIL) 1200 MG CAPS Take 1 capsule by mouth 2 (two) times daily.        [name] omeprazole (PRILOSEC) 20 MG capsule Take 20 mg by mouth daily.        . Saw Palmetto, Serenoa repens, (SAW PALMETTO PO) Take 2 capsules by mouth daily.       . vitamin C (ASCORBIC ACID) 500 MG tablet Take 500 mg by mouth daily.  No current facility-administered medications on file prior to visit.    No Known Allergies  Past Medical History  Diagnosis Date  . Coronary artery disease     post PTCA and stent of the distal RCA. He has a 3.0 mm espress 2 stent deployed at 14 atmospheres placed in [place], [place]            . Dyslipidemia   . Erectile dysfunction   . Chest pain   . Chest pain     Past Surgical History  Procedure Laterality Date  . Coronary angioplasty with stent placement      post PTCA and stent of the distal RCA. He has a 3.0 mm espress 2 stent deployed at 14 atmospheres placed in [place], [place]            . Total knee arthroplasty      left    History  Smoking status  . Never Smoker   Smokeless tobacco  . Never Used    History  Alcohol Use No    Family History  Problem Relation Age of Onset  . Aortic aneurysm Father   . Cancer Father     colon   . Coronary artery disease Mother     with CABG  . Alzheimer's disease Mother   . Hypertension Brother     Reviw of Systems:  Reviewed in the HPI.  All other  systems are negative.  Physical Exam: Blood pressure 162/100, pulse 51, height 6' (1.829 m), weight 203 lb (92.08 kg). General: Well developed, well nourished, in no acute distress.  Head: Normocephalic, atraumatic, sclera non-icteric, mucus membranes are moist,   Neck: Supple. Carotids are 2 + without bruits. No JVD  Lungs: Clear bilaterally to auscultation.  Heart: regular rate  With normal  S1 S2. No murmurs, gallops or rubs.  Abdomen: Soft, non-tender, non-distended with normal bowel sounds. No hepatomegaly. No rebound/guarding. No masses.  Msk:  Strength and tone are normal  Extremities: No clubbing or cyanosis. No edema.  Distal pedal pulses are 2+ and equal bilaterally.  Neuro: Alert and oriented X 3. Moves all extremities spontaneously.  Psych:  Responds to questions appropriately with a normal affect.  ECG: June 20, 2013:  Sinus brady at 51.  Normal othwise.  Assessment / Plan:

## 2013-08-31 ENCOUNTER — Other Ambulatory Visit: Payer: Self-pay | Admitting: Cardiovascular Disease

## 2014-06-20 HISTORY — PX: CORONARY ARTERY BYPASS GRAFT: SHX141

## 2014-07-16 ENCOUNTER — Encounter: Payer: Self-pay | Admitting: Cardiovascular Disease

## 2014-09-04 ENCOUNTER — Encounter: Payer: Self-pay | Admitting: Cardiovascular Disease

## 2015-05-19 ENCOUNTER — Observation Stay (HOSPITAL_COMMUNITY)
Admission: EM | Admit: 2015-05-19 | Discharge: 2015-05-20 | Disposition: A | Discharge status: Home / Self Care | Payer: Medicare Other | Attending: Internal Medicine | Admitting: Internal Medicine

## 2015-05-19 ENCOUNTER — Ambulatory Visit (INDEPENDENT_AMBULATORY_CARE_PROVIDER_SITE_OTHER): Payer: Medicare Other | Admitting: Family Medicine

## 2015-05-19 ENCOUNTER — Ambulatory Visit (INDEPENDENT_AMBULATORY_CARE_PROVIDER_SITE_OTHER): Payer: Medicare Other

## 2015-05-19 ENCOUNTER — Emergency Department (HOSPITAL_COMMUNITY): Payer: Medicare Other

## 2015-05-19 ENCOUNTER — Encounter (HOSPITAL_COMMUNITY): Payer: Self-pay | Admitting: *Deleted

## 2015-05-19 VITALS — BP 110/70 | HR 90 | Temp 99.2°F | Resp 14 | Ht 73.0 in | Wt 216.0 lb

## 2015-05-19 DIAGNOSIS — R51 Headache: Secondary | ICD-10-CM | POA: Diagnosis not present

## 2015-05-19 DIAGNOSIS — T148 Other injury of unspecified body region: Secondary | ICD-10-CM | POA: Diagnosis not present

## 2015-05-19 DIAGNOSIS — R0609 Other forms of dyspnea: Secondary | ICD-10-CM | POA: Diagnosis not present

## 2015-05-19 DIAGNOSIS — R42 Dizziness and giddiness: Secondary | ICD-10-CM

## 2015-05-19 DIAGNOSIS — R413 Other amnesia: Secondary | ICD-10-CM

## 2015-05-19 DIAGNOSIS — R531 Weakness: Secondary | ICD-10-CM | POA: Insufficient documentation

## 2015-05-19 DIAGNOSIS — Z7982 Long term (current) use of aspirin: Secondary | ICD-10-CM | POA: Diagnosis not present

## 2015-05-19 DIAGNOSIS — I10 Essential (primary) hypertension: Secondary | ICD-10-CM | POA: Diagnosis present

## 2015-05-19 DIAGNOSIS — E860 Dehydration: Secondary | ICD-10-CM | POA: Diagnosis not present

## 2015-05-19 DIAGNOSIS — R0789 Other chest pain: Secondary | ICD-10-CM | POA: Diagnosis not present

## 2015-05-19 DIAGNOSIS — R519 Headache, unspecified: Secondary | ICD-10-CM

## 2015-05-19 DIAGNOSIS — E785 Hyperlipidemia, unspecified: Secondary | ICD-10-CM | POA: Diagnosis not present

## 2015-05-19 DIAGNOSIS — R2681 Unsteadiness on feet: Secondary | ICD-10-CM

## 2015-05-19 DIAGNOSIS — I2510 Atherosclerotic heart disease of native coronary artery without angina pectoris: Secondary | ICD-10-CM | POA: Diagnosis not present

## 2015-05-19 DIAGNOSIS — G44039 Episodic paroxysmal hemicrania, not intractable: Secondary | ICD-10-CM

## 2015-05-19 DIAGNOSIS — R509 Fever, unspecified: Secondary | ICD-10-CM | POA: Insufficient documentation

## 2015-05-19 DIAGNOSIS — W57XXXA Bitten or stung by nonvenomous insect and other nonvenomous arthropods, initial encounter: Secondary | ICD-10-CM | POA: Insufficient documentation

## 2015-05-19 DIAGNOSIS — F0390 Unspecified dementia without behavioral disturbance: Secondary | ICD-10-CM | POA: Insufficient documentation

## 2015-05-19 DIAGNOSIS — N179 Acute kidney failure, unspecified: Secondary | ICD-10-CM | POA: Diagnosis not present

## 2015-05-19 DIAGNOSIS — Z955 Presence of coronary angioplasty implant and graft: Secondary | ICD-10-CM | POA: Diagnosis not present

## 2015-05-19 DIAGNOSIS — R079 Chest pain, unspecified: Secondary | ICD-10-CM | POA: Diagnosis present

## 2015-05-19 HISTORY — DX: Headache, unspecified: R51.9

## 2015-05-19 HISTORY — DX: Headache: R51

## 2015-05-19 LAB — URINALYSIS, ROUTINE W REFLEX MICROSCOPIC
Bilirubin Urine: NEGATIVE
Glucose, UA: NEGATIVE mg/dL
Hgb urine dipstick: NEGATIVE
Ketones, ur: NEGATIVE mg/dL
LEUKOCYTES UA: NEGATIVE
NITRITE: NEGATIVE
PH: 6.5 (ref 5.0–8.0)
Protein, ur: 30 mg/dL — AB
SPECIFIC GRAVITY, URINE: 1.021 (ref 1.005–1.030)

## 2015-05-19 LAB — TROPONIN I: Troponin I: <0.03 ng/mL (ref <0.031)

## 2015-05-19 LAB — POCT CBC
GRANULOCYTE PERCENT: 79 % (ref 37–80)
HEMATOCRIT: 38.7 % — AB (ref 43.5–53.7)
HEMOGLOBIN: 13.7 g/dL — AB (ref 14.1–18.1)
Lymph, poc: 0.6 (ref 0.6–3.4)
MCH, POC: 31.4 pg — AB (ref 27–31.2)
MCHC: 35.5 g/dL — AB (ref 31.8–35.4)
MCV: 88.5 fL (ref 80–97)
MID (cbc): 0.1 (ref 0–0.9)
MPV: 7.6 fL (ref 0–99.8)
PLATELET COUNT, POC: 132 10*3/uL — AB (ref 142–424)
POC GRANULOCYTE: 3.4 (ref 2–6.9)
POC LYMPH PERCENT: 17.7 %L (ref 10–50)
POC MID %: 3.3 %M (ref 0–12)
RBC: 4.37 M/uL — AB (ref 4.69–6.13)
RDW, POC: 13.4 %
WBC: 4.3 10*3/uL — AB (ref 4.6–10.2)

## 2015-05-19 LAB — COMPREHENSIVE METABOLIC PANEL
ALT: 40 U/L (ref 17–63)
ANION GAP: 8 (ref 5–15)
AST: 50 U/L — ABNORMAL HIGH (ref 15–41)
Albumin: 3.6 g/dL (ref 3.5–5.0)
Alkaline Phosphatase: 71 U/L (ref 38–126)
BUN: 16 mg/dL (ref 6–20)
CHLORIDE: 102 mmol/L (ref 101–111)
CO2: 26 mmol/L (ref 22–32)
Calcium: 9.1 mg/dL (ref 8.9–10.3)
Creatinine, Ser: 1.39 mg/dL — ABNORMAL HIGH (ref 0.61–1.24)
GFR calc non Af Amer: 51 mL/min — ABNORMAL LOW (ref >60)
GFR, EST AFRICAN AMERICAN: 59 mL/min — AB (ref >60)
Glucose, Bld: 119 mg/dL — ABNORMAL HIGH (ref 65–99)
POTASSIUM: 4.4 mmol/L (ref 3.5–5.1)
SODIUM: 136 mmol/L (ref 135–145)
TOTAL PROTEIN: 6.4 g/dL — AB (ref 6.5–8.1)
Total Bilirubin: 1 mg/dL (ref 0.3–1.2)

## 2015-05-19 LAB — URINE MICROSCOPIC-ADD ON
RBC / HPF: NONE SEEN RBC/hpf (ref 0–5)
WBC, UA: NONE SEEN WBC/hpf (ref 0–5)

## 2015-05-19 LAB — CBC WITH DIFFERENTIAL/PLATELET
BASOS ABS: 0 10*3/uL (ref 0.0–0.1)
BASOS PCT: 0 %
Eosinophils Absolute: 0 10*3/uL (ref 0.0–0.7)
Eosinophils Relative: 0 %
HEMATOCRIT: 41 % (ref 39.0–52.0)
Hemoglobin: 13.5 g/dL (ref 13.0–17.0)
Lymphocytes Relative: 16 %
Lymphs Abs: 0.7 10*3/uL (ref 0.7–4.0)
MCH: 30.4 pg (ref 26.0–34.0)
MCHC: 32.9 g/dL (ref 30.0–36.0)
MCV: 92.3 fL (ref 78.0–100.0)
MONO ABS: 0.4 10*3/uL (ref 0.1–1.0)
Monocytes Relative: 8 %
NEUTROS ABS: 3.3 10*3/uL (ref 1.7–7.7)
Neutrophils Relative %: 76 %
PLATELETS: 143 10*3/uL — AB (ref 150–400)
RBC: 4.44 MIL/uL (ref 4.22–5.81)
RDW: 13.4 % (ref 11.5–15.5)
WBC: 4.4 10*3/uL (ref 4.0–10.5)

## 2015-05-19 LAB — I-STAT CG4 LACTIC ACID, ED
LACTIC ACID, VENOUS: 0.76 mmol/L (ref 0.5–2.0)
Lactic Acid, Venous: 1.03 mmol/L (ref 0.5–2.0)

## 2015-05-19 LAB — BRAIN NATRIURETIC PEPTIDE: B NATRIURETIC PEPTIDE 5: 19.5 pg/mL (ref 0.0–100.0)

## 2015-05-19 LAB — PROTIME-INR
INR: 1.27 (ref 0.00–1.49)
Prothrombin Time: 16.1 seconds — ABNORMAL HIGH (ref 11.6–15.2)

## 2015-05-19 LAB — INFLUENZA PANEL BY PCR (TYPE A & B)
H1N1 flu by pcr: NOT DETECTED
Influenza A By PCR: NEGATIVE
Influenza B By PCR: NEGATIVE

## 2015-05-19 LAB — CBG MONITORING, ED: Glucose-Capillary: 120 mg/dL — ABNORMAL HIGH (ref 65–99)

## 2015-05-19 LAB — GLUCOSE, POCT (MANUAL RESULT ENTRY): POC Glucose: 125 mg/dl — AB (ref 70–99)

## 2015-05-19 MED ORDER — PANTOPRAZOLE SODIUM 40 MG PO TBEC: 40.0000 mg | DELAYED_RELEASE_TABLET | Freq: Every day | ORAL | Status: DC

## 2015-05-19 MED ORDER — HYDROCODONE-ACETAMINOPHEN 5-325 MG PO TABS: 1.0000 | ORAL_TABLET | ORAL | Status: DC | PRN

## 2015-05-19 MED ORDER — HEPARIN SODIUM (PORCINE) 5000 UNIT/ML IJ SOLN
5000.0000 [IU] | Freq: Three times a day (TID) | INTRAMUSCULAR | Status: DC
  Filled 2015-05-19: qty 1

## 2015-05-19 MED ORDER — ONDANSETRON HCL 4 MG PO TABS: 4.0000 mg | ORAL_TABLET | Freq: Four times a day (QID) | ORAL | Status: DC | PRN

## 2015-05-19 MED ORDER — SODIUM CHLORIDE 0.9% FLUSH: 3.0000 mL | Freq: Two times a day (BID) | INTRAVENOUS | Status: DC

## 2015-05-19 MED ORDER — ACETAMINOPHEN 325 MG PO TABS: 650.0000 mg | ORAL_TABLET | Freq: Four times a day (QID) | ORAL | Status: DC | PRN

## 2015-05-19 MED ORDER — DOXYCYCLINE HYCLATE 100 MG PO TABS
100.0000 mg | ORAL_TABLET | Freq: Once | ORAL | Status: AC
  Administered 2015-05-19: 100 mg via ORAL
  Filled 2015-05-19: qty 1

## 2015-05-19 MED ORDER — DOXYCYCLINE HYCLATE 100 MG PO TABS: 100.0000 mg | ORAL_TABLET | Freq: Two times a day (BID) | ORAL | Status: DC

## 2015-05-19 MED ORDER — SODIUM CHLORIDE 0.9 % IV BOLUS (SEPSIS)
1000.0000 mL | Freq: Once | INTRAVENOUS | Status: AC
  Administered 2015-05-19: 1000 mL via INTRAVENOUS

## 2015-05-19 MED ORDER — ASPIRIN 81 MG PO CHEW
81.0000 mg | CHEWABLE_TABLET | Freq: Every day | ORAL | Status: DC
  Administered 2015-05-19: 81 mg via ORAL
  Filled 2015-05-19: qty 1

## 2015-05-19 MED ORDER — ONDANSETRON HCL 4 MG/2ML IJ SOLN: 4.0000 mg | Freq: Four times a day (QID) | INTRAMUSCULAR | Status: DC | PRN

## 2015-05-19 MED ORDER — SODIUM CHLORIDE 0.9 % IV SOLN
INTRAVENOUS | Status: DC
  Administered 2015-05-19: 19:00:00 via INTRAVENOUS

## 2015-05-19 MED ORDER — AMLODIPINE BESYLATE 10 MG PO TABS
10.0000 mg | ORAL_TABLET | Freq: Every day | ORAL | Status: DC
  Administered 2015-05-19: 10 mg via ORAL
  Filled 2015-05-19: qty 1

## 2015-05-19 MED ORDER — HYDRALAZINE HCL 20 MG/ML IJ SOLN: 10.0000 mg | Freq: Four times a day (QID) | INTRAMUSCULAR | Status: DC | PRN

## 2015-05-19 NOTE — Progress Notes (Addendum)
Subjective:  By signing my name below, I, Moises Blood, attest that this documentation has been prepared under the direction and in the presence of Merri Ray, MD. Electronically Signed: Moises Blood, [place]. 05/19/2015 , 10:28 AM .  Patient was seen in Room 12 .   Patient ID: [name], male    DOB: 21-Feb-1945, 70 y.o.   MRN: [id number] Chief Complaint  Patient presents with  . Fever    Tick removed 2 weeks ago  . Shortness of Breath    2 days   HPI [name] is a 70 y.o. male H/o HTN, CAD, sinus bradycardia, and memory loss.   Patient found a tick on him and was able to remove it after working out in the yard about 2 weeks ago. He mentions having a headache at the top of his head. Yesterday, the headache persists around 11:00AM and measured fever 100.5. He took an ibuprofen with improvement. He reports his headache continues today with tmax 101.9, but the headache is no longer localized at the top, and feels "more full". He denies taking any medication this morning. He denies any changes in his memory loss.   He also isn't able to keep his balance since yesterday around 12:00PM. When he stands up, he feels a little unsteady. He's noticed more shortness of breath going up stairs in the past 2 days. He denies any falls. He denies history of stroke. He denies congestion, rhinorrhea, dysuria, cough, frequency, diarrhea, vomiting, abdominal pain, rash, chest pain, chest tightness, neck pain, neck stiffness, photophobia, and phonophobia.   He has had 2 past surgical history in his eyes: piece of glass in his eye (removed in 1988) and there was a growth.   He's from [place], [place].   Patient Active Problem List   Diagnosis Date Noted  . Pulsatile abdominal mass 05/17/2012  . Hyperlipidemia 11/09/2011  . Sinus bradycardia 11/09/2011  . HTN (hypertension) 09/18/2010  . CAD (coronary artery disease) 09/18/2010   Past Medical History  Diagnosis Date  . Coronary artery  disease     post PTCA and stent of the distal RCA. He has a 3.0 mm espress 2 stent deployed at 14 atmospheres placed in [place], [place]            . Dyslipidemia   . Erectile dysfunction   . Chest pain   . Chest pain   . Hypertension    Past Surgical History  Procedure Laterality Date  . Coronary angioplasty with stent placement      post PTCA and stent of the distal RCA. He has a 3.0 mm espress 2 stent deployed at 14 atmospheres placed in [place], [place]            . Total knee arthroplasty      left   No Known Allergies Prior to Admission medications   Medication Sig Start Date End Date Taking? Authorizing Provider  aspirin 81 MG tablet Take 81 mg by mouth daily.     Yes Historical Provider, MD  celecoxib (CELEBREX) 200 MG capsule Take 200 mg by mouth daily.     Yes Historical Provider, MD  Misc Natural Products (OSTEO BI-FLEX JOINT SHIELD) TABS Take 1 tablet by mouth daily.     Yes Historical Provider, MD  multivitamin [facility]) per tablet Take 1 tablet by mouth daily.     Yes Historical Provider, MD  Omega-3 Fatty Acids (FISH OIL) 1200 MG CAPS Take 1 capsule by mouth 2 (two) times daily.  Yes Historical Provider, MD  omeprazole (PRILOSEC) 20 MG capsule Take 20 mg by mouth daily.     Yes Historical Provider, MD  Saw Palmetto, Serenoa repens, (SAW PALMETTO PO) Take 2 capsules by mouth daily.    Yes Historical Provider, MD  vitamin C (ASCORBIC ACID) 500 MG tablet Take 500 mg by mouth daily.     Yes Historical Provider, MD   Social History   Social History  . Marital Status: Married    Spouse Name: N/A  . Number of Children: N/A  . Years of Education: N/A   Occupational History  . Not on file.   Social History Main Topics  . Smoking status: Never Smoker   . Smokeless tobacco: Never Used  . Alcohol Use: No  . Drug Use: No  . Sexual Activity: Not on file   Other Topics Concern  . Not on file   Social History Narrative   Review of Systems  Constitutional: Positive  for fever.  HENT: Negative for congestion and rhinorrhea.   Eyes: Negative for photophobia.  Respiratory: Positive for shortness of breath. Negative for cough, chest tightness and wheezing.   Cardiovascular: Negative for chest pain.  Gastrointestinal: Negative for nausea, vomiting, abdominal pain and diarrhea.  Genitourinary: Negative for dysuria and frequency.  Musculoskeletal: Positive for gait problem. Negative for neck pain and neck stiffness.  Skin: Negative for rash and wound.  Neurological: Positive for headaches. Negative for speech difficulty.       Objective:   Physical Exam  Constitutional: He is oriented to person, place, and time. He appears well-developed and well-nourished. No distress.  HENT:  Head: Normocephalic and atraumatic.  Nose: Right sinus exhibits no maxillary sinus tenderness and no frontal sinus tenderness. Left sinus exhibits no maxillary sinus tenderness and no frontal sinus tenderness.  Eyes: EOM are normal. Pupils are equal, round, and reactive to light. Right eye exhibits no nystagmus. Left eye exhibits no nystagmus.  Scar in medial aspect of his right eye  Neck: Neck supple. Carotid bruit is not present.  Cardiovascular: Normal rate, regular rhythm and normal heart sounds.  Exam reveals no gallop and no friction rub.   No murmur heard. Pulmonary/Chest: Effort normal and breath sounds normal. No respiratory distress.  Abdominal: Soft. There is no tenderness.  Musculoskeletal: Normal range of motion.  C-spine: full rom  Lymphadenopathy:    He has no cervical adenopathy.  Neurological: He is alert and oriented to person, place, and time.  No pronator drift, slight decreased movement on left but reportedly baseline, otherwise no facial droop or weakness, no slurred speech, negative romberg, normal finger to nose, slightly wide based gait  Skin: Skin is warm and dry. No rash noted.  Psychiatric: He has a normal mood and affect. His behavior is normal.    Nursing note and vitals reviewed.   Filed Vitals:   05/19/15 0956  BP: 110/70  Pulse: 90  Temp: 99.2 F (37.3 C)  TempSrc: Oral  Resp: 14  Height: 6\' 1"  (1.854 m)  Weight: 216 lb (97.977 kg)  SpO2: 93%   EKG: sinus rhythm, no acute findings, non specific Twave in lead III and v6  Results for orders placed or performed in visit on 05/19/15  POCT glucose (manual entry)  Result Value Ref Range   POC Glucose 125 (A) 70 - 99 mg/dl  POCT CBC  Result Value Ref Range   WBC 4.3 (A) 4.6 - 10.2 K/uL   Lymph, poc 0.6 0.6 - 3.4  POC LYMPH PERCENT 17.7 10 - 50 %L   MID (cbc) 0.1 0 - 0.9   POC MID % 3.3 0 - 12 %M   POC Granulocyte 3.4 2 - 6.9   Granulocyte percent 79.0 37 - 80 %G   RBC 4.37 (A) 4.69 - 6.13 M/uL   Hemoglobin 13.7 (A) 14.1 - 18.1 g/dL   HCT, POC 38.7 (A) 43.5 - 53.7 %   MCV 88.5 80 - 97 fL   MCH, POC 31.4 (A) 27 - 31.2 pg   MCHC 35.5 (A) 31.8 - 35.4 g/dL   RDW, POC 13.4 %   Platelet Count, POC 132 (A) 142 - 424 K/uL   MPV 7.6 0 - 99.8 fL        Assessment & Plan:  [name] is a 70 y.o. male Fever, unspecified - Plan: POCT CBC, DG Chest 2 View  Acute nonintractable headache, unspecified headache type  DOE (dyspnea on exertion) - Plan: EKG 12-Lead, DG Chest 2 View  Unsteady gait  Dizziness - Plan: POCT glucose (manual entry), POCT CBC  Memory difficulty  History of CAD, hypertension, hyperlipidemia, and memory difficulty usually followed in [facility] Now with headache for the past 2-3 days, fever for 2 days, and a balance difficulty/gait abnormality that is new since yesterday afternoon. No apparent change in his baseline memory difficulty, but headache and gait difficulty are new. History of tick bite that was removed approximately 2 weeks ago, but did not report any subsequent rash or fever until yesterday. Did admit to some increased breathlessness with walking upstairs past few days, but no concerning findings on EKG or chest x-ray.  Differential includes viral or possible delayed onset tickborne illness with encephalopathy/cerebritis, or less likely CVA.     -Discussed with charge nurse at [facility]. Patient will go by private vehicle with his wife for further evaluation.   No orders of the defined types were placed in this encounter.   Patient Instructions       IF you received an x-ray today, you will receive an invoice from [facility]. Please contact [facility] at [phone] with questions or concerns regarding your invoice.   IF you received labwork today, you will receive an invoice from [employer]. Please contact [facility] at [phone] with questions or concerns regarding your invoice.   Our billing staff will not be able to assist you with questions regarding bills from these companies.  You will be contacted with the lab results as soon as they are available. The fastest way to get your results is to activate your My Chart account. Instructions are located on the last page of this paperwork. If you have not heard from [place] regarding the results in 2 weeks, please contact this office.     Go to [facility] emergency room after leaving our office to evaluate your symptoms further. As we discussed, with a fever, and headache, I think you will need some other testing done through the emergency room. I did discuss this with the charge nurse, so they are expecting you.      I personally performed the services described in this documentation, which was scribed in my presence. The recorded information has been reviewed and considered, and addended by me as needed.

## 2015-05-19 NOTE — ED Notes (Addendum)
Pt sent here from [facility]. Reports having a fever, headache and fatigue for several days. Had tick removed two weeks ago. No acute distress noted at triage. Had bloodwork, chest xray and ekg done at [facility].

## 2015-05-19 NOTE — Progress Notes (Signed)
Patient received from ED, family present at bedside. Tele monitor called in, IV fluids started, oriented to room call light within reach.

## 2015-05-19 NOTE — ED Provider Notes (Signed)
CSN: [id number]     Arrival date & time 05/19/15  1159 History   First MD Initiated Contact with Patient 05/19/15 1430     Chief Complaint  Patient presents with  . Fever  . Headache     (Consider location/radiation/quality/duration/timing/severity/associated sxs/prior Treatment) HPI Comments: Patient is a 70 year old male with history of CAD who presents with headache and fever. The patient reports that he had a sharp headache on the top of his head that began Thursday. He rates his pain at that time as a 10/10, "off the charts." His headache eased but he states that it is still there intermittently. He rates his pain as a 2/10 today. He has associated neck pain, but he states that he always has neck pain when he gets headaches. Yesterday he had a temperature of 100.5, and today he had temperature of 101.5. The patient has also had increased shortness of breath with exertion, which is not normal for the patient. Patient denies any rashes. Patient reports that he removed a tick 2 weeks ago from his abdomen. The area was red for several days, but has since resolved. The patient was still alive when they removed it. Patient denies chest pain, abdominal pain, nausea, vomiting, dysuria.  Patient is a 70 y.o. male presenting with fever and headaches. The history is provided by the patient.  Fever Associated symptoms: headaches   Associated symptoms: no chest pain, no chills, no dysuria, no nausea, no rash, no sore throat and no vomiting   Headache Associated symptoms: fever and neck pain   Associated symptoms: no abdominal pain, no back pain, no nausea, no sore throat and no vomiting     Past Medical History  Diagnosis Date  . Coronary artery disease     post PTCA and stent of the distal RCA. He has a 3.0 mm espress 2 stent deployed at 14 atmospheres placed in [place], [place]            . Dyslipidemia   . Erectile dysfunction   . Chest pain   . Chest pain   . Hypertension    Past Surgical  History  Procedure Laterality Date  . Coronary angioplasty with stent placement      post PTCA and stent of the distal RCA. He has a 3.0 mm espress 2 stent deployed at 14 atmospheres placed in [place], [place]            . Total knee arthroplasty      left  . Back surgery     Family History  Problem Relation Age of Onset  . Aortic aneurysm Father   . Cancer Father     colon   . Coronary artery disease Mother     with CABG  . Alzheimer's disease Mother   . Hypertension Brother    Social History  Substance Use Topics  . Smoking status: Never Smoker   . Smokeless tobacco: Never Used  . Alcohol Use: No    Review of Systems  Constitutional: Positive for fever. Negative for chills.  HENT: Negative for facial swelling and sore throat.   Respiratory: Negative for shortness of breath.   Cardiovascular: Negative for chest pain.  Gastrointestinal: Negative for nausea, vomiting and abdominal pain.  Genitourinary: Negative for dysuria.  Musculoskeletal: Positive for neck pain. Negative for back pain.  Skin: Negative for rash and wound.  Neurological: Positive for headaches.  Psychiatric/Behavioral: The patient is not nervous/anxious.       Allergies  Oxycodone-acetaminophen  Home  Medications   Prior to Admission medications   Medication Sig Start Date End Date Taking? Authorizing Provider  amLODipine (NORVASC) 2.5 MG tablet Take 2.5 mg by mouth at bedtime. 05/15/15  Yes Historical Provider, MD  aspirin EC 81 MG tablet Take 81 mg by mouth daily.   Yes Historical Provider, MD  atorvastatin (LIPITOR) 40 MG tablet Take 40 mg by mouth daily.   Yes Historical Provider, MD  benazepril (LOTENSIN) 10 MG tablet Take 10 mg by mouth at bedtime. 05/15/15  Yes Historical Provider, MD  celecoxib (CELEBREX) 200 MG capsule Take 200 mg by mouth daily.     Yes Historical Provider, MD  citalopram (CELEXA) 20 MG tablet Take 20 mg by mouth daily.   Yes Historical Provider, MD  lansoprazole (PREVACID)  15 MG capsule Take 15 mg by mouth daily at 12 noon.   Yes Historical Provider, MD  Misc Natural Products (OSTEO BI-FLEX JOINT SHIELD) TABS Take 1 tablet by mouth daily.     Yes Historical Provider, MD  Multiple Vitamin (MULTIVITAMIN WITH MINERALS) TABS tablet Take 1 tablet by mouth daily.   Yes Historical Provider, MD  Omega-3 Fatty Acids (FISH OIL) 1200 MG CAPS Take 1,200 mg by mouth daily.    Yes Historical Provider, MD  rivastigmine (EXELON) 4.6 mg/24hr Place 4.6 mg onto the skin daily. 03/19/15  Yes Historical Provider, MD  Saw Palmetto, Serenoa repens, (SAW PALMETTO PO) Take 1 capsule by mouth at bedtime.    Yes Historical Provider, MD  vitamin C (ASCORBIC ACID) 500 MG tablet Take 500 mg by mouth at bedtime.    Yes Historical Provider, MD   BP 125/76 mmHg  Pulse 63  Temp(Src) 99.2 F (37.3 C) (Rectal)  Resp 18  Wt 99.655 kg  SpO2 97% Physical Exam  Constitutional: He appears well-developed and well-nourished. No distress.  HENT:  Head: Normocephalic and atraumatic.  Mouth/Throat: Oropharynx is clear and moist. No oropharyngeal exudate.  No tenderness of head on palpation  Eyes: Conjunctivae and EOM are normal. Pupils are equal, round, and reactive to light. Right eye exhibits no discharge. Left eye exhibits no discharge. No scleral icterus.  Neck: Normal range of motion. Neck supple. No thyromegaly present.  Cardiovascular: Normal rate, regular rhythm, normal heart sounds and intact distal pulses.  Exam reveals no gallop and no friction rub.   No murmur heard. Pulmonary/Chest: Effort normal and breath sounds normal. No stridor. No respiratory distress. He has no wheezes. He has no rales.  Abdominal: Soft. Bowel sounds are normal. He exhibits no distension. There is no tenderness. There is no rebound and no guarding.  Musculoskeletal: He exhibits no edema.  Lymphadenopathy:    He has no cervical adenopathy.  Neurological: He is alert. Coordination normal.  CN 3-12 intact, 5/5  strength throughout, normal sensation, no ataxia on finger to nose, grip strength strong and symmetrical  Skin: Skin is warm and dry. No rash noted. He is not diaphoretic. No pallor.  Psychiatric: He has a normal mood and affect.  Nursing note and vitals reviewed.   ED Course  Procedures (including critical care time) Labs Review Labs Reviewed  COMPREHENSIVE METABOLIC PANEL - Abnormal; Notable for the following:    Glucose, Bld 119 (*)    Creatinine, Ser 1.39 (*)    Total Protein 6.4 (*)    AST 50 (*)    GFR calc non Af Amer 51 (*)    GFR calc Af Amer 59 (*)    All other components within normal  limits  CBC WITH DIFFERENTIAL/PLATELET - Abnormal; Notable for the following:    Platelets 143 (*)    All other components within normal limits  PROTIME-INR - Abnormal; Notable for the following:    Prothrombin Time 16.1 (*)    All other components within normal limits  CBG MONITORING, ED - Abnormal; Notable for the following:    Glucose-Capillary 120 (*)    All other components within normal limits  CULTURE, BLOOD (ROUTINE X 2)  CULTURE, BLOOD (ROUTINE X 2)  URINE CULTURE  TROPONIN I  BRAIN NATRIURETIC PEPTIDE  INFLUENZA PANEL BY PCR (TYPE A & B, H1N1)  URINALYSIS, ROUTINE W REFLEX MICROSCOPIC (NOT AT [facility])  ROCKY MTN SPOTTED FVR ABS PNL(IGG+IGM)  B. BURGDORFI ANTIBODIES  CSF CELL COUNT WITH DIFFERENTIAL  CSF CELL COUNT WITH DIFFERENTIAL  PROTEIN AND GLUCOSE, CSF  TROPONIN I  TROPONIN I  URINALYSIS, ROUTINE W REFLEX MICROSCOPIC (NOT AT [facility])  I-STAT CG4 LACTIC ACID, ED  I-STAT CG4 LACTIC ACID, ED    Imaging Review Dg Chest 2 View  05/19/2015  CLINICAL DATA:  Found tic EXAM: CHEST  2 VIEW COMPARISON:  None. FINDINGS: Previous median sternotomy and CABG procedure. Heart size is normal. No pleural effusion or edema. No airspace consolidation. The visualized osseous structures are unremarkable. IMPRESSION: 1. No active cardiopulmonary abnormalities. Electronically Signed   By:  Kerby Moors M.D.   On: 05/19/2015 11:16   Ct Head Wo Contrast  05/19/2015  CLINICAL DATA:  Headache, fever, weakness EXAM: CT HEAD WITHOUT CONTRAST TECHNIQUE: Contiguous axial images were obtained from the base of the skull through the vertex without intravenous contrast. COMPARISON:  None. FINDINGS: There is no evidence of mass effect, midline shift, or extra-axial fluid collections. There is no evidence of a space-occupying lesion or intracranial hemorrhage. There is no evidence of a cortical-based area of acute infarction. There is periventricular white matter low attenuation likely secondary to microangiopathy. The ventricles and sulci are appropriate for the patient's age. The basal cisterns are patent. Visualized portions of the orbits are unremarkable. The visualized portions of the paranasal sinuses and mastoid air cells are unremarkable. Cerebrovascular atherosclerotic calcifications are noted. The osseous structures are unremarkable. IMPRESSION: No acute intracranial pathology. Electronically Signed   By: Kathreen Devoid   On: 05/19/2015 16:16   I have personally reviewed and evaluated these images and lab results as part of my medical decision-making.   EKG Interpretation   Date/Time:  Sunday May 19 2015 15:31:38 EDT Ventricular Rate:  60 PR Interval:  165 QRS Duration: 95 QT Interval:  413 QTC Calculation: 413 R Axis:   36 Text Interpretation:  Sinus rhythm Posterior infarct, old No significant  change was found Confirmed by RANCOUR  MD, STEPHEN [phone]) on 05/19/2015  5:31:07 PM      MDM   CBC unremarkable, with exception of platelets 143. CMP shows creatinine 1.39, protein 6.4, AST 50, glucose 119. Troponin less than 0.03. BNP 19.5. Pro time 16.1, INR 1.27. Influenza panel negative. Lactate 1.03. Blood cultures, rocking on spotted fever and Lyme titers sent. CT head without contrast shows no acute intracranial pathology. EKG shows NSR with nonspecific T-wave abnormality and  minimal ST elevation in lead 3, aVF as well as T-wave inversion in 1, aVL. Patient and wife adamantly decline lumbar puncture. Dr. Wyvonnia Dusky also evaluated the patient and consulted internal medicine. Dr. Candiss Norse will admit the patient for observation. Transfer of care for further evaluation and treatment. Patient understands and is in agreement with plan.  Final diagnoses:  Fever, unspecified fever cause  Headache, unspecified headache type  Tick bite        Frederica Kuster, PA-C 05/19/15 1923  Ezequiel Essex, MD 05/19/15 2239

## 2015-05-19 NOTE — ED Notes (Signed)
He feels ok

## 2015-05-19 NOTE — ED Notes (Signed)
Admitting doctor at bedside 

## 2015-05-19 NOTE — H&P (Addendum)
TRH H&P   Patient Demographics:    [name], is a 70 y.o. male  MRN: [id number]   DOB - 05-14-1945  Admit Date - 05/19/2015  Outpatient Primary MD for the patient is Moshe Cipro  Referring MD/NP/PA: Dr. Melanee Left  Outpatient Specialists: [facility]    Patient coming from: [name]  Chief Complaint  Patient presents with  . Fever  . Headache      HPI:    [name]  is a 70 y.o. male, History of CAD status post CABG in [place] one year ago, carotid artery disease status post left endarterectomy, dementia, essential hypertension, who is visiting our area for a family illness and has been here for the last several weeks, had a tick bite 2 weeks ago, tick was on his body for about one day.  Patient was doing well until about 3 days ago when he started having mild generalized headache followed by low-grade fevers for the last 2 days, fevers up to 101, he also had some problems with generalized weakness and wobbly knees, came to the urgent care center from where he was sent to our ER. He did not have any chest pain, no palpitations, no shortness of breath or cough, no abdominal pain, no diarrhea or dysuria, no joint pains or skin rashes. No focal weakness. Denies any photophobia or neck stiffness.  In the ER head CT unremarkable, patient and wife have refused an LP after multiple counseling sessions by the ER physician and me. Bloodwork unrevealing except for mild ARF. I was called to admit the patient for headaches after discussions with ID physician Dr. Graylon Good over the phone.    Review of systems:    In addition to the HPI above,  Positive Fever-chills, Positive  Headache, No changes with Vision or hearing, No problems swallowing food or Liquids, No Chest pain, Cough or Shortness of Breath, No Abdominal pain, No Nausea or Vommitting, Bowel movements are regular, No Blood in stool or Urine, No dysuria, No new skin rashes or bruises, No new joints pains-aches,  No new weakness, tingling, numbness in any extremity, mild generalized weakness No recent weight gain or loss, No polyuria, polydypsia or polyphagia, No significant Mental Stressors.  A full 10 point Review of Systems was done, except as stated above, all  other Review of Systems were negative.   With Past History of the following :    Past Medical History  Diagnosis Date  . Coronary artery disease     post PTCA and stent of the distal RCA. He has a 3.0 mm espress 2 stent deployed at 14 atmospheres placed in [place], [place]            . Dyslipidemia   . Erectile dysfunction   . Chest pain   . Chest pain   . Hypertension       Past Surgical History  Procedure Laterality Date  . Coronary angioplasty with stent placement      post PTCA and stent of the distal RCA. He has a 3.0 mm espress 2 stent deployed at 14 atmospheres placed in [place], [place]            . Total knee arthroplasty      left  . Back surgery        Social History:     Social History  Substance Use Topics  . Smoking status: Never Smoker   . Smokeless tobacco: Never Used  . Alcohol Use: No     Lives - At home with wife, fairly mobile and active      Family History :     Family History  Problem Relation Age of Onset  . Aortic aneurysm Father   . Cancer Father     colon   . Coronary artery disease Mother     with CABG  . Alzheimer's disease Mother   . Hypertension Brother        Home Medications:   Prior to Admission medications   Medication Sig Start Date End Date Taking? Authorizing Provider  aspirin 81 MG tablet Take 81 mg by mouth daily.      Historical Provider, MD  celecoxib  (CELEBREX) 200 MG capsule Take 200 mg by mouth daily.      Historical Provider, MD  Misc Natural Products (OSTEO BI-FLEX JOINT SHIELD) TABS Take 1 tablet by mouth daily.      Historical Provider, MD  multivitamin [facility]) per tablet Take 1 tablet by mouth daily.      Historical Provider, MD  Omega-3 Fatty Acids (FISH OIL) 1200 MG CAPS Take 1 capsule by mouth 2 (two) times daily.      Historical Provider, MD  omeprazole (PRILOSEC) 20 MG capsule Take 20 mg by mouth daily.      Historical Provider, MD  Saw Palmetto, Serenoa repens, (SAW PALMETTO PO) Take 2 capsules by mouth daily.     Historical Provider, MD  vitamin C (ASCORBIC ACID) 500 MG tablet Take 500 mg by mouth daily.      Historical Provider, MD     Allergies:    No Known Allergies   Physical Exam:   Vitals  Blood pressure 125/76, pulse 63, temperature 99.2 F (37.3 C), temperature source Rectal, resp. rate 18, weight 99.655 kg (219 lb 11.2 oz), SpO2 97 %.   1. General Pleasant middle-aged well-built white male lying in bed in NAD,     2. Normal affect and insight, Not Suicidal or Homicidal, Awake , pleasantly confused but at his baseline  3. No F.N deficits, ALL C.Nerves Intact, Strength 5/5 all 4 extremities, Sensation intact all 4 extremities, Plantars down going.  4. Ears and Eyes appear Normal, Conjunctivae clear, PERRLA. Moist Oral Mucosa.  5. Supple Neck, negative Kernig's and Brudenzki's sign, No JVD, No cervical lymphadenopathy appriciated, No Carotid  Bruits.  6. Symmetrical Chest wall movement, Good air movement bilaterally, CTAB.  7. RRR, No Gallops, Rubs or Murmurs, No Parasternal Heave.  8. Positive Bowel Sounds, Abdomen Soft, No tenderness, No organomegaly appriciated,No rebound -guarding or rigidity.  9.  No Cyanosis, Normal Skin Turgor, No Skin Rash or Bruise.  10. Good muscle tone,  joints appear normal , no effusions, Normal ROM.  11. No Palpable Lymph Nodes in Neck or Axillae      Data Review:      CBC  Recent Labs Lab 05/19/15 1049 05/19/15 1508  WBC 4.3* 4.4  HGB 13.7* 13.5  HCT 38.7* 41.0  PLT  --  143*  MCV 88.5 92.3  MCH 31.4* 30.4  MCHC 35.5* 32.9  RDW  --  13.4  LYMPHSABS  --  0.7  MONOABS  --  0.4  EOSABS  --  0.0  BASOSABS  --  0.0   ------------------------------------------------------------------------------------------------------------------  Chemistries   Recent Labs Lab 05/19/15 1302  NA 136  K 4.4  CL 102  CO2 26  GLUCOSE 119*  BUN 16  CREATININE 1.39*  CALCIUM 9.1  AST 50*  ALT 40  ALKPHOS 71  BILITOT 1.0   ------------------------------------------------------------------------------------------------------------------ estimated creatinine clearance is 64 mL/min (by C-G formula based on Cr of 1.39). ------------------------------------------------------------------------------------------------------------------ No results for input(s): TSH, T4TOTAL, T3FREE, THYROIDAB in the last 72 hours.  Invalid input(s): FREET3  Coagulation profile  Recent Labs Lab 05/19/15 1508  INR 1.27   ------------------------------------------------------------------------------------------------------------------- No results for input(s): DDIMER in the last 72 hours. -------------------------------------------------------------------------------------------------------------------  Cardiac Enzymes  Recent Labs Lab 05/19/15 1403  TROPONINI <0.03   ------------------------------------------------------------------------------------------------------------------    Component Value Date/Time   BNP 19.5 05/19/2015 1508     ---------------------------------------------------------------------------------------------------------------  Urinalysis    Component Value Date/Time   COLORURINE YELLOW 06/19/2008 0920   APPEARANCEUR CLOUDY* 06/19/2008 0920   LABSPEC 1.019 06/19/2008 0920   PHURINE 6.0 06/19/2008 0920   GLUCOSEU NEGATIVE  06/19/2008 0920   HGBUR NEGATIVE 06/19/2008 0920   BILIRUBINUR NEGATIVE 06/19/2008 0920   KETONESUR NEGATIVE 06/19/2008 0920   PROTEINUR NEGATIVE 06/19/2008 0920   UROBILINOGEN 1.0 06/19/2008 0920   NITRITE NEGATIVE 06/19/2008 0920   LEUKOCYTESUR  06/19/2008 0920    NEGATIVE MICROSCOPIC NOT DONE ON URINES WITH NEGATIVE PROTEIN, BLOOD, LEUKOCYTES, NITRITE, OR GLUCOSE <1000 mg/dL.    ----------------------------------------------------------------------------------------------------------------   Imaging Results:    Dg Chest 2 View  05/19/2015  CLINICAL DATA:  Found tic EXAM: CHEST  2 VIEW COMPARISON:  None. FINDINGS: Previous median sternotomy and CABG procedure. Heart size is normal. No pleural effusion or edema. No airspace consolidation. The visualized osseous structures are unremarkable. IMPRESSION: 1. No active cardiopulmonary abnormalities. Electronically Signed   By: Kerby Moors M.D.   On: 05/19/2015 11:16   Ct Head Wo Contrast  05/19/2015  CLINICAL DATA:  Headache, fever, weakness EXAM: CT HEAD WITHOUT CONTRAST TECHNIQUE: Contiguous axial images were obtained from the base of the skull through the vertex without intravenous contrast. COMPARISON:  None. FINDINGS: There is no evidence of mass effect, midline shift, or extra-axial fluid collections. There is no evidence of a space-occupying lesion or intracranial hemorrhage. There is no evidence of a cortical-based area of acute infarction. There is periventricular white matter low attenuation likely secondary to microangiopathy. The ventricles and sulci are appropriate for the patient's age. The basal cisterns are patent. Visualized portions of the orbits are unremarkable. The visualized portions of the paranasal sinuses and mastoid air cells are unremarkable. Cerebrovascular atherosclerotic calcifications  are noted. The osseous structures are unremarkable. IMPRESSION: No acute intracranial pathology. Electronically Signed   By: Kathreen Devoid   On: 05/19/2015 16:16    My personal review of EKG: Rhythm NSR, nonspecific changes   Assessment & Plan:     1. Low-grade fevers and generalized headache after a tick bite 2 weeks ago. Could be viral infection was his [facility] spotted fever. Case discussed with ID physician Dr. Graylon Good. He does not have signs of meningitis, no meningismus, [facility] spotted fever serum serology sent, blood cultures obtained, influence her PCR sent although suspicion low, we will place him on doxycycline for now, he has nontoxic and benign appearance.   Gentle hydration with IV fluids, PT eval for generalized weakness. If clinically better discharged tomorrow on doxycycline for suspected [facility] spotted fever.  Note ideal evaluation would include LP but patient and wife have refused multiple requests. Head CT is unremarkable, he has no meningismus signs. Clinically certainly this is not bacterial meningitis, could be viral meningitis or secondary headache due to systemic viral or systemic RMSF infection.    2. CAD status post CABG and stents. Chest pain-free. Continue aspirin, EKG nonspecific, cycle troponin, telemetry monitor.  3. Hypertension. Placed on Norvasc and as needed hydralazine. Address headache.  4. Dementia. Remains at risk for delirium, wife explained of expected delirium, avoid benzodiazepines and narcotics.  5. Generalized weakness. Due to #1 + dehydration - Gentle hydration, PT eval. Obtain UA.  6. Mild ARF - IVF, baseline creat 1, (avoid NSAIDs he uses at home) .   DVT Prophylaxis Heparin   AM Labs Ordered, also please review Full Orders  Family Communication: Admission, patients condition and plan of care including tests being ordered have been discussed with the patient and wife who indicate understanding and agree with the plan and Code Status.  Code Status Full  Likely DC to  home in 24 hours  Condition Fair  Consults called: ID over the  phone  Admission status: Obs Tele  Time spent in minutes : 30   Lala Lund K M.D on 05/19/2015 at 6:10 PM  Between 7am to 7pm - Pager - [phone]. After 7pm go to [email or web address] - password [facility]  [facility] - Office  [phone]

## 2015-05-19 NOTE — ED Provider Notes (Signed)
Medical screening examination/treatment/procedure(s) were conducted as a shared visit with non-physician practitioner(s) and myself.  I personally evaluated the patient during the encounter.   EKG Interpretation   Date/Time:  Sunday May 19 2015 13:14:34 EDT Ventricular Rate:  65 PR Interval:  164 QRS Duration: 88 QT Interval:  420 QTC Calculation: 436 R Axis:   34 Text Interpretation:  Normal sinus rhythm Nonspecific T wave abnormality  Abnormal ECG minimal STE in III, aVF similar to 15 min prior T wave  inversion I/aVL similar to 15 min prior both new from 2010 No significant  change since last tracing Confirmed by [facility] MD, Corene Cornea [phone]) on  05/19/2015 1:26:12 PM Also confirmed by [facility] MD, Corene Cornea [phone]), editor  Gilford Rile, CCT, [place] ([id number])  on 05/19/2015 3:17:52 PM      Patient sent from urgent care with sudden onset headache of 3 days' duration, fatigue, exertional shortness of breath, and fever that started yesterday.  Nonfocal neurological exam. Well-appearing. No meningismus. Neurologically intact.  CT head negative. Neuro exam nonfocal. CXR neg. UA negative.   Recommend a lumbar puncture to patient and wife. Patient's wife is hesitant to proceed that she is worried about side effects. Attempted to reassure her that this is a routine procedure. There is a low suspicion for bacterial meningitis but viral or aseptic meningitis is possible as well as recommended spotted fever or Lyme disease.  Discussed with Dr. Baxter Flattery of infectious disease. She feels patient could have potentially have aseptic meningitis and does recommend lumbar puncture.  Patient and wife continued to decline lumbar puncture. His wife "just has a bad feeling about it". They understand that meningitis cannot be ruled out without a lumbar puncture. Patient clinically does not have bacterial meningitis but viral or aseptic meningitis is possible.  They're agreeable to observation admission and doxycycline  treatment. He also has exertional shortness of breath over the past several days with ST elevation in lead 3 and aVF. No chest pain.  Troponin negative.  observation admission d/w Dr. Candiss Norse.  Ezequiel Essex, MD 05/19/15 2237

## 2015-05-19 NOTE — Addendum Note (Signed)
Addended by: Merri Ray R on: 05/19/2015 11:25 AM   Modules accepted: Level of Service

## 2015-05-19 NOTE — ED Notes (Signed)
EKG completed given to EDP.  

## 2015-05-19 NOTE — ED Notes (Addendum)
Pt having near syncopal episode at triage, feels lightheaded, pale and diaphoretic. cbg 120.

## 2015-05-19 NOTE — Patient Instructions (Addendum)
     IF you received an x-ray today, you will receive an invoice from [facility]. Please contact [facility] at [phone] with questions or concerns regarding your invoice.   IF you received labwork today, you will receive an invoice from [employer]. Please contact [facility] at [phone] with questions or concerns regarding your invoice.   Our billing staff will not be able to assist you with questions regarding bills from these companies.  You will be contacted with the lab results as soon as they are available. The fastest way to get your results is to activate your My Chart account. Instructions are located on the last page of this paperwork. If you have not heard from [place] regarding the results in 2 weeks, please contact this office.     Go to [facility] emergency room after leaving our office to evaluate your symptoms further. As we discussed, with a fever, and headache, I think you will need some other testing done through the emergency room. I did discuss this with the charge nurse, so they are expecting you.

## 2015-05-19 NOTE — ED Notes (Signed)
Attempted report 

## 2015-05-19 NOTE — ED Notes (Signed)
Report called to tonya on 2 w

## 2015-05-20 ENCOUNTER — Encounter (HOSPITAL_COMMUNITY): Payer: Self-pay | Admitting: General Practice

## 2015-05-20 DIAGNOSIS — R0789 Other chest pain: Secondary | ICD-10-CM | POA: Diagnosis not present

## 2015-05-20 DIAGNOSIS — R509 Fever, unspecified: Secondary | ICD-10-CM | POA: Insufficient documentation

## 2015-05-20 LAB — CBC
HEMATOCRIT: 36.7 % — AB (ref 39.0–52.0)
Hemoglobin: 12.2 g/dL — ABNORMAL LOW (ref 13.0–17.0)
MCH: 30.4 pg (ref 26.0–34.0)
MCHC: 33.2 g/dL (ref 30.0–36.0)
MCV: 91.5 fL (ref 78.0–100.0)
PLATELETS: 131 10*3/uL — AB (ref 150–400)
RBC: 4.01 MIL/uL — AB (ref 4.22–5.81)
RDW: 13.5 % (ref 11.5–15.5)
WBC: 3.2 10*3/uL — AB (ref 4.0–10.5)

## 2015-05-20 LAB — BASIC METABOLIC PANEL
Anion gap: 8 (ref 5–15)
BUN: 17 mg/dL (ref 6–20)
CHLORIDE: 104 mmol/L (ref 101–111)
CO2: 24 mmol/L (ref 22–32)
CREATININE: 1.12 mg/dL (ref 0.61–1.24)
Calcium: 8.4 mg/dL — ABNORMAL LOW (ref 8.9–10.3)
GFR calc Af Amer: >60 mL/min (ref >60)
GFR calc non Af Amer: >60 mL/min (ref >60)
Glucose, Bld: 127 mg/dL — ABNORMAL HIGH (ref 65–99)
POTASSIUM: 4 mmol/L (ref 3.5–5.1)
SODIUM: 136 mmol/L (ref 135–145)

## 2015-05-20 LAB — TROPONIN I: Troponin I: <0.03 ng/mL (ref <0.031)

## 2015-05-20 MED ORDER — DOXYCYCLINE HYCLATE 100 MG PO TABS: 100.0000 mg | ORAL_TABLET | Freq: Two times a day (BID) | ORAL | Status: DC

## 2015-05-20 NOTE — Discharge Summary (Signed)
Physician Discharge Summary  [name] [id number] DOB: [birth year] DOA: 05/19/2015  PCP: [name]  Admit date: 05/19/2015 Discharge date: 05/20/2015  Recommendations for Outpatient Follow-up:  1. Pt will need to follow up with PCP in 2-3 weeks post discharge 2. Please obtain BMP to evaluate electrolytes and kidney function 3. Please also check CBC to evaluate Hg and Hct levels  Discharge Diagnoses:  Principal Problem:   Chest pain Active Problems:   HTN (hypertension)   CAD (coronary artery disease)   Hyperlipidemia   Headache    Discharge Condition: Stable  Diet recommendation: Heart healthy diet discussed in details   History of present illness:  70 y.o. male, History of CAD status post CABG in [place] one year ago, carotid artery disease status post left endarterectomy, dementia, essential hypertension, who is visiting our area for a family illness and has been here for the last several weeks, had a tick bite 2 weeks ago, tick was on his body for about one day.  Patient was doing well until about 3 days ago when he started having mild generalized headache followed by low-grade fevers for the last 2 days, fevers up to 101, he also had some problems with generalized weakness, intermittent chest pains.   Hospital Course:  Principal Problem:   Chest pain - likely musculoskeletal - no pain this AM, pt wants to go home this AM due to family illness   Active Problems:   HTN (hypertension), essential - reasonable inpatient control     CAD (coronary artery disease) - asymptomatic, continue home medical regimen     Hyperlipidemia - continue statin     Headache - ? Tick bit effects - complete treatment with doxycycline that was already started in ED per ID team recommendations   Procedures/Studies: Dg Chest 2 View  05/19/2015  CLINICAL DATA:  Found tic EXAM: CHEST  2 VIEW COMPARISON:  None. FINDINGS: Previous median sternotomy and CABG  procedure. Heart size is normal. No pleural effusion or edema. No airspace consolidation. The visualized osseous structures are unremarkable. IMPRESSION: 1. No active cardiopulmonary abnormalities. Electronically Signed   By: Kerby Moors M.D.   On: 05/19/2015 11:16   Ct Head Wo Contrast  05/19/2015  CLINICAL DATA:  Headache, fever, weakness EXAM: CT HEAD WITHOUT CONTRAST TECHNIQUE: Contiguous axial images were obtained from the base of the skull through the vertex without intravenous contrast. COMPARISON:  None. FINDINGS: There is no evidence of mass effect, midline shift, or extra-axial fluid collections. There is no evidence of a space-occupying lesion or intracranial hemorrhage. There is no evidence of a cortical-based area of acute infarction. There is periventricular white matter low attenuation likely secondary to microangiopathy. The ventricles and sulci are appropriate for the patient's age. The basal cisterns are patent. Visualized portions of the orbits are unremarkable. The visualized portions of the paranasal sinuses and mastoid air cells are unremarkable. Cerebrovascular atherosclerotic calcifications are noted. The osseous structures are unremarkable. IMPRESSION: No acute intracranial pathology. Electronically Signed   By: Kathreen Devoid   On: 05/19/2015 16:16    Discharge Exam: Filed Vitals:   05/20/15 0503 05/20/15 0840  BP: 139/82   Pulse: 69   Temp: 98.3 F (36.8 C) 99.5 F (37.5 C)  Resp: 16    Filed Vitals:   05/19/15 2000 05/19/15 2049 05/20/15 0503 05/20/15 0840  BP: 157/88 139/92 139/82   Pulse: 64 66 69   Temp:  99.5 F (37.5 C) 98.3 F (36.8 C) 99.5 F (  37.5 C)  TempSrc:  Oral Oral Oral  Resp: 17 18 16    Weight:   98.521 kg (217 lb 3.2 oz)   SpO2: 99% 98% 99%     General: Pt is alert, follows commands appropriately, not in acute distress Cardiovascular: Regular rate and rhythm, S1/S2 +, no murmurs, no rubs, no gallops Respiratory: Clear to auscultation  bilaterally, no wheezing, no crackles, no rhonchi Abdominal: Soft, non tender, non distended, bowel sounds +, no guarding Extremities: no edema, no cyanosis, pulses palpable bilaterally DP and PT Neuro: Grossly nonfocal  Discharge Instructions  Discharge Instructions    Diet - low sodium heart healthy    Complete by:  As directed      Increase activity slowly    Complete by:  As directed             Medication List    TAKE these medications        amLODipine 2.5 MG tablet  Commonly known as:  NORVASC  Take 2.5 mg by mouth at bedtime.     aspirin EC 81 MG tablet  Take 81 mg by mouth daily.     atorvastatin 40 MG tablet  Commonly known as:  LIPITOR  Take 40 mg by mouth daily.     benazepril 10 MG tablet  Commonly known as:  LOTENSIN  Take 10 mg by mouth at bedtime.     celecoxib 200 MG capsule  Commonly known as:  CELEBREX  Take 200 mg by mouth daily.     citalopram 20 MG tablet  Commonly known as:  CELEXA  Take 20 mg by mouth daily.     doxycycline 100 MG tablet  Commonly known as:  VIBRA-TABS  Take 1 tablet (100 mg total) by mouth every 12 (twelve) hours.     Fish Oil 1200 MG Caps  Take 1,200 mg by mouth daily.     lansoprazole 15 MG capsule  Commonly known as:  PREVACID  Take 15 mg by mouth daily at 12 noon.     multivitamin with minerals Tabs tablet  Take 1 tablet by mouth daily.     OSTEO BI-FLEX JOINT SHIELD Tabs  Take 1 tablet by mouth daily.     rivastigmine 4.6 mg/24hr  Commonly known as:  EXELON  Place 4.6 mg onto the skin daily.     SAW PALMETTO PO  Take 1 capsule by mouth at bedtime.     vitamin C 500 MG tablet  Commonly known as:  ASCORBIC ACID  Take 500 mg by mouth at bedtime.           Follow-up Information    Follow up with Moshe Cipro.   Specialty:  Internal Medicine   Contact information:   [place] [address] [phone]       Follow up with Faye Ramsay, MD.   Specialty:  Internal  Medicine   Why:  As needed call my cell phone [phone]   Contact information:   [address] [place] [address] [phone]        The results of significant diagnostics from this hospitalization (including imaging, microbiology, ancillary and laboratory) are listed below for reference.     Microbiology: No results found for this or any previous visit (from the past 240 hour(s)).   Labs: Basic Metabolic Panel:  Recent Labs Lab 05/19/15 1302 05/20/15 0059  NA 136 136  K 4.4 4.0  CL 102 104  CO2 26 24  GLUCOSE 119* 127*  BUN 16 17  CREATININE 1.39* 1.12  CALCIUM 9.1 8.4*   Liver Function Tests:  Recent Labs Lab 05/19/15 1302  AST 50*  ALT 40  ALKPHOS 71  BILITOT 1.0  PROT 6.4*  ALBUMIN 3.6   No results for input(s): LIPASE, AMYLASE in the last 168 hours. No results for input(s): AMMONIA in the last 168 hours. CBC:  Recent Labs Lab 05/19/15 1049 05/19/15 1508 05/20/15 0059  WBC 4.3* 4.4 3.2*  NEUTROABS  --  3.3  --   HGB 13.7* 13.5 12.2*  HCT 38.7* 41.0 36.7*  MCV 88.5 92.3 91.5  PLT  --  143* 131*   Cardiac Enzymes:  Recent Labs Lab 05/19/15 1403 05/19/15 2108 05/20/15 0059  TROPONINI <0.03 <0.03 <0.03   BNP: BNP (last 3 results)  Recent Labs  05/19/15 1508  BNP 19.5    ProBNP (last 3 results) No results for input(s): PROBNP in the last 8760 hours.  CBG:  Recent Labs Lab 05/19/15 1256  GLUCAP 120*     SIGNED: Time coordinating discharge: 30 minutes  MAGICK-Raizy Auzenne, MD  [facility] 05/20/2015, 9:01 AM Pager [phone]  If 7PM-7AM, please contact night-coverage [email or web address] Password [email or web address]

## 2015-05-20 NOTE — Discharge Instructions (Signed)
Fever, Adult A fever is an increase in the body's temperature. It is usually defined as a temperature of 100F (38C) or higher. Brief mild or moderate fevers generally have no long-term effects, and they often do not require treatment. Moderate or high fevers may make you feel uncomfortable and can sometimes be a sign of a serious illness or disease. The sweating that may occur with repeated or prolonged fever may also cause dehydration. Fever is confirmed by taking a temperature with a thermometer. A measured temperature can vary with:  Age.  Time of day.  Location of the thermometer:  Mouth (oral).  Rectum (rectal).  Ear (tympanic).  Underarm (axillary).  Forehead (temporal). HOME CARE INSTRUCTIONS Pay attention to any changes in your symptoms. Take these actions to help with your condition:  Take over-the counter and prescription medicines only as told by your health care provider. Follow the dosing instructions carefully.  If you were prescribed an antibiotic medicine, take it as told by your health care provider. Do not stop taking the antibiotic even if you start to feel better.  Rest as needed.  Drink enough fluid to keep your urine clear or pale yellow. This helps to prevent dehydration.  Sponge yourself or bathe with room-temperature water to help reduce your body temperature as needed. Do not use ice water.  Do not overbundle yourself in blankets or heavy clothes. SEEK MEDICAL CARE IF:  You vomit.  You cannot eat or drink without vomiting.  You have diarrhea.  You have pain when you urinate.  Your symptoms do not improve with treatment.  You develop new symptoms.  You develop excessive weakness. SEEK IMMEDIATE MEDICAL CARE IF:  You have shortness of breath or have trouble breathing.  You are dizzy or you faint.  You are disoriented or confused.  You develop signs of dehydration, such as a dry mouth, decreased urination, or paleness.  You develop  severe pain in your abdomen.  You have persistent vomiting or diarrhea.  You develop a skin rash.  Your symptoms suddenly get worse.   This information is not intended to replace advice given to you by your health care provider. Make sure you discuss any questions you have with your health care provider.   Document Released: 07/01/2000 Document Revised: 09/26/2014 Document Reviewed: 03/01/2014 Elsevier Interactive Patient Education 2016 Elsevier Inc.  

## 2015-05-21 LAB — URINE CULTURE: CULTURE: NO GROWTH

## 2015-05-21 LAB — RMSF, IGG, IFA

## 2015-05-21 LAB — B. BURGDORFI ANTIBODIES: B burgdorferi Ab IgG+IgM: <0.91 {ISR} (ref 0.00–0.90)

## 2015-05-21 LAB — ROCKY MTN SPOTTED FVR ABS PNL(IGG+IGM)
RMSF IGM: 0.41 {index} (ref 0.00–0.89)
RMSF IgG: POSITIVE — AB

## 2015-05-24 LAB — CULTURE, BLOOD (ROUTINE X 2)
Culture: NO GROWTH
Culture: NO GROWTH

## 2016-09-10 ENCOUNTER — Encounter: Payer: Self-pay | Admitting: Cardiovascular Disease

## 2017-05-06 ENCOUNTER — Telehealth: Payer: Self-pay | Admitting: Cardiovascular Disease

## 2017-05-06 NOTE — Telephone Encounter (Signed)
Records received from [facility] -Dr.Peter Gwenlyn Saran. Placed in Chart Prep.

## 2017-06-21 DIAGNOSIS — R413 Other amnesia: Secondary | ICD-10-CM | POA: Insufficient documentation

## 2017-07-12 ENCOUNTER — Ambulatory Visit (INDEPENDENT_AMBULATORY_CARE_PROVIDER_SITE_OTHER): Payer: Medicare Other | Admitting: Cardiovascular Disease

## 2017-07-12 ENCOUNTER — Encounter: Payer: Self-pay | Admitting: Cardiovascular Disease

## 2017-07-12 VITALS — BP 118/70 | HR 56 | Ht 72.0 in | Wt 237.0 lb

## 2017-07-12 DIAGNOSIS — R0789 Other chest pain: Secondary | ICD-10-CM

## 2017-07-12 DIAGNOSIS — I2510 Atherosclerotic heart disease of native coronary artery without angina pectoris: Secondary | ICD-10-CM

## 2017-07-12 NOTE — Progress Notes (Signed)
Cardiology Office Note:    Date:  07/12/2017   ID:  Kristopher, Houston [birth year], MRN [id number]  PCP:  Moshe Cipro, DO  Cardiologist:  Lavelle Akel   Referring MD: Moshe Cipro, DO   Chief Complaint  Patient presents with  . Coronary Artery Disease    s/p CABG   . Hypertension  . Hyperlipidemia     History of Present Illness:    [name] is a 72 y.o. male with a hx of coronary artery disease and carotid artery disease.  He is status post coronary artery bypass grafting and left carotid endarterectomy in 2016.  Seen with wife, [name]  I last saw [name] approximately 5 years ago. Moved back to [place].  Has occasional CP - typically when he breathing .  He has some memory issues.   His wife answers many of his questions.  Not related with twisting his torso  Walks on occasion - has some chest pain with that  Last lipids - Nov. 2018  Chol = 138 Trig = 132 HDL = 30 LDL = 82    Past Medical History:  Diagnosis Date  . Arthritis   . Chest pain   . Colon polyp   . Coronary artery disease    post PTCA and stent of the distal RCA. He has a 3.0 mm espress 2 stent deployed at 14 atmospheres placed in [place], [place]            . Dyslipidemia   . Erectile dysfunction   . GERD (gastroesophageal reflux disease)   . Headache   . Hyperlipidemia   . Hypertension   . Kidney stones    history   . Memory loss   . Skin cancer 2015    Past Surgical History:  Procedure Laterality Date  . BACK SURGERY    . CAROTID ENDARTERECTOMY    . COLONOSCOPY    . CORONARY ANGIOPLASTY WITH STENT PLACEMENT     post PTCA and stent of the distal RCA. He has a 3.0 mm espress 2 stent deployed at 14 atmospheres placed in [place], [place] GRAFT  06/2014  . EYE SURGERY    . HERNIA REPAIR    . JOINT REPLACEMENT     left knee  . POLYPECTOMY    . TOTAL KNEE ARTHROPLASTY     left    Current Medications: Current Meds  Medication Sig  .  amLODipine (NORVASC) 2.5 MG tablet Take 2.5 mg by mouth at bedtime.  [name] aspirin EC 81 MG tablet Take 81 mg by mouth daily.  [name] atorvastatin (LIPITOR) 40 MG tablet Take 40 mg by mouth daily.  . benazepril (LOTENSIN) 10 MG tablet Take 10 mg by mouth at bedtime.  . celecoxib (CELEBREX) 200 MG capsule Take 200 mg by mouth daily.    . citalopram (CELEXA) 20 MG tablet Take 20 mg by mouth daily.  [name] doxycycline (VIBRA-TABS) 100 MG tablet Take 1 tablet (100 mg total) by mouth every 12 (twelve) hours.  . lansoprazole (PREVACID) 15 MG capsule Take 15 mg by mouth daily at 12 noon.  . memantine (NAMENDA) 10 MG tablet Take 2 tablets by mouth daily.  . Misc Natural Products (OSTEO BI-FLEX JOINT SHIELD) TABS Take 1 tablet by mouth daily.    . Multiple Vitamin (MULTIVITAMIN WITH MINERALS) TABS tablet Take 1 tablet by mouth daily.  . Omega-3 Fatty Acids (FISH OIL) 1200  MG CAPS Take 1,200 mg by mouth daily.   . rivastigmine (EXELON) 4.6 mg/24hr Place 4.6 mg onto the skin daily.  . Saw Palmetto, Serenoa repens, (SAW PALMETTO PO) Take 1 capsule by mouth at bedtime.   . vitamin C (ASCORBIC ACID) 500 MG tablet Take 500 mg by mouth at bedtime.      Allergies:   Oxycodone-acetaminophen   Social History   Socioeconomic History  . Marital status: Married    Spouse name: Not on file  . Number of children: Not on file  . Years of education: Not on file  . Highest education level: Not on file  Occupational History  . Not on file  Social Needs  . Financial resource strain: Not on file  . Food insecurity:    Worry: Not on file    Inability: Not on file  . Transportation needs:    Medical: Not on file    Non-medical: Not on file  Tobacco Use  . Smoking status: Never Smoker  . Smokeless tobacco: Former Systems developer    Types: Chew  Substance and Sexual Activity  . Alcohol use: Yes    Comment: 0.6 oz per week   . Drug use: No  . Sexual activity: Not on file  Lifestyle  . Physical activity:    Days per week: Not on  file    Minutes per session: Not on file  . Stress: Not on file  Relationships  . Social connections:    Talks on phone: Not on file    Gets together: Not on file    Attends religious service: Not on file    Active member of club or organization: Not on file    Attends meetings of clubs or organizations: Not on file    Relationship status: Not on file  Other Topics Concern  . Not on file  Social History Narrative  . Not on file     Family History: The patient's family history includes Alzheimer's disease in his mother; Aortic aneurysm in his father; Cancer in his father; Coronary artery disease in his mother; Hypertension in his brother.  ROS:   Please see the history of present illness.     All other systems reviewed and are negative.  EKGs/Labs/Other Studies Reviewed:    The following studies were reviewed today:   EKG:   July 12, 2017: Sinus brady at 18.  No ST / T wave changes.    Recent Labs: No results found for requested labs within last 8760 hours.  Recent Lipid Panel    Component Value Date/Time   CHOL 171 06/20/2013 1118   TRIG 70.0 06/20/2013 1118   HDL 38.90 (L) 06/20/2013 1118   CHOLHDL 4 06/20/2013 1118   VLDL 14.0 06/20/2013 1118   LDLCALC 118 (H) 06/20/2013 1118    Physical Exam:    VS:  BP 118/70   Pulse (!) 56   Ht 6' (1.829 m)   Wt 237 lb (107.5 kg)   SpO2 97%   BMI 32.14 kg/m     Wt Readings from Last 3 Encounters:  07/12/17 237 lb (107.5 kg)  05/20/15 217 lb 3.2 oz (98.5 kg)  05/19/15 216 lb (98 kg)     GEN:  Elderly man, NAD  HEENT: Normal NECK: No JVD; No carotid bruits LYMPHATICS: No lymphadenopathy CARDIAC: RR, + right sided chest wall tenderness RESPIRATORY:  Clear to auscultation without rales, wheezing or rhonchi  ABDOMEN: Soft, non-tender, non-distended MUSCULOSKELETAL:  No edema; No deformity  SKIN: Warm and dry NEUROLOGIC:  Alert and oriented x 3 PSYCHIATRIC:  Normal affect   ASSESSMENT:    1. Coronary artery  disease involving native coronary artery of native heart without angina pectoris    PLAN:    In order of problems listed above:  1. Chest wall pain: [name] clearly has episodes of chest wall pain.  He has known coronary artery disease and is status post coronary artery bypass grafting.  At this point I do not think that his episodes are related to angina although I would have a low threshold to proceed with stress testing if his episodes of chest discomfort worsen.  Check lipids , BMP and HFP today    Medication Adjustments/Labs and Tests Ordered: Current medicines are reviewed at length with the patient today.  Concerns regarding medicines are outlined above.  Orders Placed This Encounter  Procedures  . Lipid Profile  . Basic Metabolic Panel (BMET)  . Hepatic function panel  . EKG 12-Lead   No orders of the defined types were placed in this encounter.   Patient Instructions  Medication Instructions:  Your physician recommends that you continue on your current medications as directed. Please refer to the Current Medication list given to you today.   Labwork: TODAY - liver panel, cholesterol, basic metabolic panel   Testing/Procedures: None Ordered   Follow-Up: Your physician wants you to follow-up in: 6 months with Dr. Acie Fredrickson. You will receive a reminder letter in the mail two months in advance. If you don't receive a letter, please call our office to schedule the follow-up appointment.   If you need a refill on your cardiac medications before your next appointment, please call your pharmacy.   Thank you for choosing [facility]! Christen Bame, RN [phone]       Signed, Mertie Moores, MD  07/12/2017 2:46 PM    [place]

## 2017-07-12 NOTE — Patient Instructions (Signed)
Medication Instructions:  Your physician recommends that you continue on your current medications as directed. Please refer to the Current Medication list given to you today.   Labwork: TODAY - liver panel, cholesterol, basic metabolic panel   Testing/Procedures: None Ordered   Follow-Up: Your physician wants you to follow-up in: 6 months with Dr. Acie Fredrickson. You will receive a reminder letter in the mail two months in advance. If you don't receive a letter, please call our office to schedule the follow-up appointment.   If you need a refill on your cardiac medications before your next appointment, please call your pharmacy.   Thank you for choosing [facility]! Christen Bame, RN [phone]

## 2017-07-13 LAB — BASIC METABOLIC PANEL WITH GFR
BUN/Creatinine Ratio: 13 (ref 10–24)
BUN: 16 mg/dL (ref 8–27)
CO2: 22 mmol/L (ref 20–29)
Calcium: 9.1 mg/dL (ref 8.6–10.2)
Chloride: 103 mmol/L (ref 96–106)
Creatinine, Ser: 1.21 mg/dL (ref 0.76–1.27)
GFR calc Af Amer: 70 mL/min/1.73
GFR calc non Af Amer: 61 mL/min/1.73
Glucose: 82 mg/dL (ref 65–99)
Potassium: 4.9 mmol/L (ref 3.5–5.2)
Sodium: 141 mmol/L (ref 134–144)

## 2017-07-13 LAB — HEPATIC FUNCTION PANEL
ALT: 30 IU/L (ref 0–44)
AST: 31 IU/L (ref 0–40)
Albumin: 4.5 g/dL (ref 3.6–4.8)
Alkaline Phosphatase: 76 IU/L (ref 39–117)
BILIRUBIN, DIRECT: 0.17 mg/dL (ref 0.00–0.40)
Bilirubin Total: 0.6 mg/dL (ref 0.0–1.2)
Total Protein: 6.5 g/dL (ref 6.0–8.5)

## 2017-07-13 LAB — LIPID PANEL
Chol/HDL Ratio: 4.4 ratio (ref 0.0–5.0)
Cholesterol, Total: 129 mg/dL (ref 100–199)
HDL: 29 mg/dL — ABNORMAL LOW
LDL Calculated: 66 mg/dL (ref 0–99)
Triglycerides: 172 mg/dL — ABNORMAL HIGH (ref 0–149)
VLDL Cholesterol Cal: 34 mg/dL (ref 5–40)

## 2017-08-04 ENCOUNTER — Telehealth: Payer: Self-pay | Admitting: Cardiovascular Disease

## 2017-08-04 NOTE — Telephone Encounter (Signed)
Records received from [facility]. Placed in Chart Prep.

## 2017-12-13 MED ORDER — ATORVASTATIN CALCIUM 40 MG PO TABS: 40.0000 mg | ORAL_TABLET | Freq: Every day | ORAL | 1 refills | Status: DC

## 2017-12-13 NOTE — Telephone Encounter (Signed)
Dr Acie Fredrickson has never refilled this for the patient. Okay to refill? Please advise. Thanks, MI

## 2018-01-24 ENCOUNTER — Encounter: Payer: Self-pay | Admitting: Cardiovascular Disease

## 2018-01-24 ENCOUNTER — Ambulatory Visit (INDEPENDENT_AMBULATORY_CARE_PROVIDER_SITE_OTHER): Payer: Medicare Other | Admitting: Cardiovascular Disease

## 2018-01-24 VITALS — BP 132/82 | HR 61 | Ht 72.0 in | Wt 230.0 lb

## 2018-01-24 DIAGNOSIS — E785 Hyperlipidemia, unspecified: Secondary | ICD-10-CM

## 2018-01-24 DIAGNOSIS — I2510 Atherosclerotic heart disease of native coronary artery without angina pectoris: Secondary | ICD-10-CM

## 2018-01-24 MED ORDER — ROSUVASTATIN CALCIUM 10 MG PO TABS: 10.0000 mg | ORAL_TABLET | Freq: Every day | ORAL | 3 refills | Status: DC

## 2018-01-24 NOTE — Progress Notes (Signed)
Cardiology Office Note:    Date:  01/24/2018   ID:  Kristopher, Houston 1945/04/05, MRN [id number]  PCP:  Chesley Noon, MD  Cardiologist:     Referring MD: Moshe Cipro, DO   Chief Complaint  Patient presents with  . Coronary Artery Disease         [name] is a 73 y.o. male with a hx of coronary artery disease and carotid artery disease.  He is status post coronary artery bypass grafting and left carotid endarterectomy in 2016.  Seen with wife, Lelon Frohlich  I last saw Clayton approximately 5 years ago. Moved back to [place].  Has occasional CP - typically when he breathing .  He has some memory issues.   His wife answers many of his questions.  Not related with twisting his torso  Walks on occasion - has some chest pain with that  Last lipids - Nov. 2018  Chol = 138 Trig = 132 HDL = 30 LDL = 82  Jan. 6 2020:  [name] is seen with his wife, Lelon Frohlich.   Has had some very brief episodes of CP   Last for several minutes.  Resolves spontaneously ,  Has not had to take a NTG  Active , exercises,  Walks  His last labs from Dr. Fayrene Fearing office reveal a total cholesterol of 144.  Triglyceride levels 147.  HDL is 29.  LDL is 86.    Past Medical History:  Diagnosis Date  . Arthritis   . Chest pain   . Colon polyp   . Coronary artery disease    post PTCA and stent of the distal RCA. He has a 3.0 mm espress 2 stent deployed at 14 atmospheres placed in [place], [place]            . Dyslipidemia   . Erectile dysfunction   . GERD (gastroesophageal reflux disease)   . Headache   . Hyperlipidemia   . Hypertension   . Kidney stones    history   . Memory loss   . Skin cancer 2015    Past Surgical History:  Procedure Laterality Date  . BACK SURGERY    . CAROTID ENDARTERECTOMY    . COLONOSCOPY    . CORONARY ANGIOPLASTY WITH STENT PLACEMENT     post PTCA and stent of the distal RCA. He has a 3.0 mm espress 2 stent deployed at 14 atmospheres placed in [place],  [place] GRAFT  06/2014  . EYE SURGERY    . HERNIA REPAIR    . JOINT REPLACEMENT     left knee  . POLYPECTOMY    . TOTAL KNEE ARTHROPLASTY     left    Current Medications: Current Meds  Medication Sig  . amLODipine (NORVASC) 2.5 MG tablet Take 2.5 mg by mouth at bedtime.  [name] aspirin EC 81 MG tablet Take 81 mg by mouth daily.  . benazepril (LOTENSIN) 10 MG tablet Take 10 mg by mouth at bedtime.  . celecoxib (CELEBREX) 200 MG capsule Take 200 mg by mouth daily.    . citalopram (CELEXA) 20 MG tablet Take 20 mg by mouth daily.  . lansoprazole (PREVACID) 15 MG capsule Take 15 mg by mouth daily at 12 noon.  . memantine (NAMENDA) 10 MG tablet Take 2 tablets by mouth daily.  . Misc Natural Products (OSTEO BI-FLEX JOINT SHIELD) TABS Take 1 tablet by mouth daily.    [name]  Multiple Vitamin (MULTIVITAMIN WITH MINERALS) TABS tablet Take 1 tablet by mouth daily.  . Omega-3 Fatty Acids (FISH OIL) 1200 MG CAPS Take 1,200 mg by mouth daily.   . rivastigmine (EXELON) 4.6 mg/24hr Place 4.6 mg onto the skin daily.  . Saw Palmetto, Serenoa repens, (SAW PALMETTO PO) Take 1 capsule by mouth at bedtime.   . vitamin C (ASCORBIC ACID) 500 MG tablet Take 500 mg by mouth at bedtime.   . [DISCONTINUED] atorvastatin (LIPITOR) 40 MG tablet Take 1 tablet (40 mg total) by mouth daily.     Allergies:   Oxycodone-acetaminophen   Social History   Socioeconomic History  . Marital status: Married    Spouse name: Not on file  . Number of children: Not on file  . Years of education: Not on file  . Highest education level: Not on file  Occupational History  . Not on file  Social Needs  . Financial resource strain: Not on file  . Food insecurity:    Worry: Not on file    Inability: Not on file  . Transportation needs:    Medical: Not on file    Non-medical: Not on file  Tobacco Use  . Smoking status: Never Smoker  . Smokeless tobacco: Former Systems developer    Types: Chew  Substance and  Sexual Activity  . Alcohol use: Yes    Comment: 0.6 oz per week   . Drug use: No  . Sexual activity: Not on file  Lifestyle  . Physical activity:    Days per week: Not on file    Minutes per session: Not on file  . Stress: Not on file  Relationships  . Social connections:    Talks on phone: Not on file    Gets together: Not on file    Attends religious service: Not on file    Active member of club or organization: Not on file    Attends meetings of clubs or organizations: Not on file    Relationship status: Not on file  Other Topics Concern  . Not on file  Social History Narrative  . Not on file     Family History: The patient's family history includes Alzheimer's disease in his mother; Aortic aneurysm in his father; Cancer in his father; Coronary artery disease in his mother; Hypertension in his brother.  ROS:   Please see the history of present illness.     All other systems reviewed and are negative.    EKGs/Labs/Other Studies Reviewed:    The following studies were reviewed today:   EKG:     Recent Labs: 07/12/2017: ALT 30; BUN 16; Creatinine, Ser 1.21; Potassium 4.9; Sodium 141  Recent Lipid Panel    Component Value Date/Time   CHOL 129 07/12/2017 1446   TRIG 172 (H) 07/12/2017 1446   HDL 29 (L) 07/12/2017 1446   CHOLHDL 4.4 07/12/2017 1446   CHOLHDL 4 06/20/2013 1118   VLDL 14.0 06/20/2013 1118   LDLCALC 66 07/12/2017 1446   Physical Exam: Blood pressure 132/82, pulse 61, height 6' (1.829 m), weight 230 lb (104.3 kg), SpO2 94 %.  GEN:  Well nourished, well developed in no acute distress HEENT: Normal NECK: No JVD; No carotid bruits LYMPHATICS: No lymphadenopathy CARDIAC: RRR  RESPIRATORY:  Clear to auscultation without rales, wheezing or rhonchi  ABDOMEN: Soft, non-tender, non-distended MUSCULOSKELETAL:  No edema; No deformity  SKIN: Warm and dry NEUROLOGIC:  Alert and oriented x 3    ASSESSMENT:    1.  Coronary artery disease involving  native coronary artery of native heart without angina pectoris   2. Hyperlipidemia, unspecified hyperlipidemia type    PLAN:    In order of problems listed above:  1. Chest wall pain:  -   2.  Hyperlipidemia: His last LDL is 86. While he is on a high-dose statin and by some criteria this is adequate, I would like to see his LDL around 70.  We will discontinue the atorvastatin and start him on rosuvastatin 10 mg a day.  We will check fasting lipids, liver, BMP   in 3 months.   Medication Adjustments/Labs and Tests Ordered: Current medicines are reviewed at length with the patient today.  Concerns regarding medicines are outlined above.  Orders Placed This Encounter  Procedures  . Lipid Profile  . Hepatic function panel  . Basic Metabolic Panel (BMET)   Meds ordered this encounter  Medications  . rosuvastatin (CRESTOR) 10 MG tablet    Sig: Take 1 tablet (10 mg total) by mouth daily.    Dispense:  90 tablet    Refill:  3    Patient Instructions  Medication Instructions:  Your physician has recommended you make the following change in your medication:  STOP Atorvastatin (Lipitor) START Rosuvastatin (Crestor) 10 mg once daily  If you need a refill on your cardiac medications before your next appointment, please call your pharmacy.    Lab work: Your physician recommends that you return for lab work in: 3 months  You will need to FAST for this appointment - nothing to eat or drink after midnight the night before except water.    Testing/Procedures: None Ordered   Follow-Up: At [facility], you and your health needs are our priority.  As part of our continuing mission to provide you with exceptional heart care, we have created designated Provider Care Teams.  These Care Teams include your primary Cardiologist (physician) and Advanced Practice Providers (APPs -  Physician Assistants and Nurse Practitioners) who all work together to provide you with the care you need, when  you need it. You will need a follow up appointment in:  1 years.  Please call our office 2 months in advance to schedule this appointment.  You may see Mertie Moores, MD or one of the following Advanced Practice Providers on your designated Care Team: Richardson Dopp, PA-C [place], [place] . Daune Perch, NP      Signed, Mertie Moores, MD  01/24/2018 6:23 PM    [place]

## 2018-01-24 NOTE — Patient Instructions (Addendum)
Medication Instructions:  Your physician has recommended you make the following change in your medication:  STOP Atorvastatin (Lipitor) START Rosuvastatin (Crestor) 10 mg once daily  If you need a refill on your cardiac medications before your next appointment, please call your pharmacy.    Lab work: Your physician recommends that you return for lab work in: 3 months  You will need to FAST for this appointment - nothing to eat or drink after midnight the night before except water.    Testing/Procedures: None Ordered   Follow-Up: At [facility], you and your health needs are our priority.  As part of our continuing mission to provide you with exceptional heart care, we have created designated Provider Care Teams.  These Care Teams include your primary Cardiologist (physician) and Advanced Practice Providers (APPs -  Physician Assistants and Nurse Practitioners) who all work together to provide you with the care you need, when you need it. You will need a follow up appointment in:  1 years.  Please call our office 2 months in advance to schedule this appointment.  You may see Mertie Moores, MD or one of the following Advanced Practice Providers on your designated Care Team: Richardson Dopp, PA-C [place], [place] . Daune Perch, NP

## 2018-03-02 ENCOUNTER — Telehealth: Payer: Self-pay | Admitting: *Deleted

## 2018-03-02 NOTE — Telephone Encounter (Signed)
   [facility] Pre-operative Risk Assessment    Request for surgical clearance:  1. What type of surgery is being performed? CATARACT EXTRACTION OD 03/17/18 W/INTRAOCULAR LENS IMPLANT; FOLLOWED BY OS 04/14/18  2. When is this surgery scheduled? 03/17/18   3. What type of clearance is required (medical clearance vs. Pharmacy clearance to hold med vs. Both)? MEDICAL  4. Are there any medications that need to be held prior to surgery and how long?PER DR. LISA SUN PT DOES NOT NEED TO HOLD ANY MEDICATIONS   5. Practice name and name of physician performing surgery? [facility]; DR. LISA SUN   6. What is your office phone number [phone]    7.   What is your office fax number [phone]  8.   Anesthesia type (None, local, MAC, general) ? TOPICAL WITH IV MEDICATIONS   Julaine Hua 03/02/2018, 2:10 PM  _________________________________________________________________   (provider comments below)

## 2018-03-03 NOTE — Telephone Encounter (Signed)
   Primary Cardiologist: Mertie Moores, MD  Chart reviewed as part of pre-operative protocol coverage. Cataract extractions are recognized in guidelines as low risk surgeries that do not typically require specific preoperative testing or holding of blood thinner therapy. Therefore, given past medical history and time since last visit, based on ACC/AHA guidelines, [name] would be at acceptable risk for the planned procedure without further cardiovascular testing.   I will route this recommendation to the requesting party via Epic fax function and remove from pre-op pool.  Please call with questions.  [place], [place] 03/03/2018, 1:54 PM

## 2018-04-21 ENCOUNTER — Telehealth: Payer: Self-pay | Admitting: Cardiovascular Disease

## 2018-04-21 NOTE — Telephone Encounter (Signed)
Spoke with patient's wife and rescheduled his lab appointment to June 3. Patient's wife was grateful for the call.

## 2018-04-21 NOTE — Telephone Encounter (Signed)
New Message:    Pt wants to know if it will be alright to reschedule his lab work until May please?

## 2018-04-25 ENCOUNTER — Other Ambulatory Visit: Payer: Medicare Other

## 2018-06-20 ENCOUNTER — Telehealth: Payer: Self-pay | Admitting: *Deleted

## 2018-06-20 NOTE — Telephone Encounter (Signed)
    COVID-19 Pre-Screening Questions:  . In the past 7 to 10 days have you had a cough,  shortness of breath, headache, congestion, fever (100 or greater) body aches, chills, sore throat, or sudden loss of taste or sense of smell? . Have you been around anyone with known Covid 19. . Have you been around anyone who is awaiting Covid 19 test results in the past 7 to 10 days? . Have you been around anyone who has been exposed to Covid 19, or has mentioned symptoms of Covid 19 within the past 7 to 10 days?  If you have any concerns/questions about symptoms patients report during screening (either on the phone or at threshold). Contact the provider seeing the patient or DOD for further guidance.  If neither are available contact a member of the leadership team.   Contacted patient via telephone call. All covid 19 Questions were no. Patient has a mask. KB

## 2018-06-22 ENCOUNTER — Other Ambulatory Visit: Payer: Medicare Other | Admitting: *Deleted

## 2018-06-22 ENCOUNTER — Other Ambulatory Visit: Payer: Self-pay

## 2018-06-22 DIAGNOSIS — E785 Hyperlipidemia, unspecified: Secondary | ICD-10-CM

## 2018-06-22 DIAGNOSIS — I2510 Atherosclerotic heart disease of native coronary artery without angina pectoris: Secondary | ICD-10-CM

## 2018-06-22 LAB — LIPID PANEL
Chol/HDL Ratio: 4.1 ratio (ref 0.0–5.0)
Cholesterol, Total: 118 mg/dL (ref 100–199)
HDL: 29 mg/dL — ABNORMAL LOW (ref >39)
LDL Calculated: 61 mg/dL (ref 0–99)
Triglycerides: 140 mg/dL (ref 0–149)
VLDL Cholesterol Cal: 28 mg/dL (ref 5–40)

## 2018-06-22 LAB — HEPATIC FUNCTION PANEL
ALT: 26 IU/L (ref 0–44)
AST: 27 IU/L (ref 0–40)
Albumin: 4.5 g/dL (ref 3.8–4.8)
Alkaline Phosphatase: 67 IU/L (ref 39–117)
Bilirubin Total: 0.5 mg/dL (ref 0.0–1.2)
Bilirubin, Direct: 0.16 mg/dL (ref 0.00–0.40)
Total Protein: 6.2 g/dL (ref 6.0–8.5)

## 2018-06-22 LAB — BASIC METABOLIC PANEL
BUN/Creatinine Ratio: 14 (ref 10–24)
BUN: 19 mg/dL (ref 8–27)
CO2: 27 mmol/L (ref 20–29)
Calcium: 9.3 mg/dL (ref 8.6–10.2)
Chloride: 102 mmol/L (ref 96–106)
Creatinine, Ser: 1.38 mg/dL — ABNORMAL HIGH (ref 0.76–1.27)
GFR calc Af Amer: 59 mL/min/{1.73_m2} — ABNORMAL LOW (ref >59)
GFR calc non Af Amer: 51 mL/min/{1.73_m2} — ABNORMAL LOW (ref >59)
Glucose: 102 mg/dL — ABNORMAL HIGH (ref 65–99)
Potassium: 5.1 mmol/L (ref 3.5–5.2)
Sodium: 143 mmol/L (ref 134–144)

## 2018-08-09 ENCOUNTER — Telehealth: Payer: Self-pay | Admitting: Cardiovascular Disease

## 2018-08-09 NOTE — Telephone Encounter (Signed)
New Message   Pt c/o of Chest Pain: STAT if CP now or developed within 24 hours  1. Are you having CP right now? Yes, but light  2. Are you experiencing any other symptoms (ex. SOB, nausea, vomiting, sweating)? no  3. How long have you been experiencing CP? Since 11:30am   4. Is your CP continuous or coming and going? continous  5. Have you taken Nitroglycerin? No    They gave him 4 baby aspirins and a glass of milk  ?

## 2018-08-09 NOTE — Telephone Encounter (Signed)
Difficlt situation  Has had chest pressure for 4 hours.  Pt has hx of CAD , CABG  Has dementia and cannot remember what his previous angina was like   I talked with his wife, Lelon Frohlich I think he should go to the ER .  She would like to take him to there med center [facility] - which is fairly close to them     Mertie Moores, MD  08/09/2018 4:51 PM    [address],  [place], [place]  [address] Pager [phone] Phone: [phone]; Fax: [phone]

## 2018-08-11 ENCOUNTER — Observation Stay (HOSPITAL_COMMUNITY)
Admission: EM | Admit: 2018-08-11 | Discharge: 2018-08-12 | Disposition: A | Discharge status: Home / Self Care | Payer: Medicare Other | Attending: Internal Medicine | Admitting: Internal Medicine

## 2018-08-11 ENCOUNTER — Other Ambulatory Visit: Payer: Self-pay

## 2018-08-11 ENCOUNTER — Emergency Department (HOSPITAL_COMMUNITY): Payer: Medicare Other

## 2018-08-11 DIAGNOSIS — S01411A Laceration without foreign body of right cheek and temporomandibular area, initial encounter: Secondary | ICD-10-CM | POA: Diagnosis not present

## 2018-08-11 DIAGNOSIS — Y92000 Kitchen of unspecified non-institutional (private) residence as  the place of occurrence of the external cause: Secondary | ICD-10-CM | POA: Insufficient documentation

## 2018-08-11 DIAGNOSIS — I129 Hypertensive chronic kidney disease with stage 1 through stage 4 chronic kidney disease, or unspecified chronic kidney disease: Secondary | ICD-10-CM | POA: Diagnosis not present

## 2018-08-11 DIAGNOSIS — Z951 Presence of aortocoronary bypass graft: Secondary | ICD-10-CM | POA: Insufficient documentation

## 2018-08-11 DIAGNOSIS — I2510 Atherosclerotic heart disease of native coronary artery without angina pectoris: Secondary | ICD-10-CM | POA: Diagnosis not present

## 2018-08-11 DIAGNOSIS — Y999 Unspecified external cause status: Secondary | ICD-10-CM | POA: Insufficient documentation

## 2018-08-11 DIAGNOSIS — Y939 Activity, unspecified: Secondary | ICD-10-CM | POA: Insufficient documentation

## 2018-08-11 DIAGNOSIS — Z20828 Contact with and (suspected) exposure to other viral communicable diseases: Secondary | ICD-10-CM | POA: Insufficient documentation

## 2018-08-11 DIAGNOSIS — F0390 Unspecified dementia without behavioral disturbance: Secondary | ICD-10-CM | POA: Diagnosis not present

## 2018-08-11 DIAGNOSIS — Z23 Encounter for immunization: Secondary | ICD-10-CM | POA: Diagnosis not present

## 2018-08-11 DIAGNOSIS — S129XXA Fracture of neck, unspecified, initial encounter: Secondary | ICD-10-CM

## 2018-08-11 DIAGNOSIS — N183 Chronic kidney disease, stage 3 (moderate): Secondary | ICD-10-CM | POA: Insufficient documentation

## 2018-08-11 DIAGNOSIS — R001 Bradycardia, unspecified: Secondary | ICD-10-CM | POA: Insufficient documentation

## 2018-08-11 DIAGNOSIS — W07XXXA Fall from chair, initial encounter: Secondary | ICD-10-CM | POA: Insufficient documentation

## 2018-08-11 DIAGNOSIS — R55 Syncope and collapse: Secondary | ICD-10-CM | POA: Diagnosis not present

## 2018-08-11 DIAGNOSIS — M6281 Muscle weakness (generalized): Secondary | ICD-10-CM | POA: Insufficient documentation

## 2018-08-11 DIAGNOSIS — S0181XA Laceration without foreign body of other part of head, initial encounter: Secondary | ICD-10-CM

## 2018-08-11 DIAGNOSIS — W19XXXA Unspecified fall, initial encounter: Secondary | ICD-10-CM

## 2018-08-11 DIAGNOSIS — S12401A Unspecified nondisplaced fracture of fifth cervical vertebra, initial encounter for closed fracture: Secondary | ICD-10-CM | POA: Insufficient documentation

## 2018-08-11 LAB — CREATININE, SERUM
Creatinine, Ser: 1.24 mg/dL (ref 0.61–1.24)
GFR calc Af Amer: >60 mL/min (ref >60)
GFR calc non Af Amer: 59 mL/min — ABNORMAL LOW (ref >60)

## 2018-08-11 LAB — CBC WITH DIFFERENTIAL/PLATELET
Abs Immature Granulocytes: 0.02 10*3/uL (ref 0.00–0.07)
Basophils Absolute: 0 10*3/uL (ref 0.0–0.1)
Basophils Relative: 0 %
Eosinophils Absolute: 0 10*3/uL (ref 0.0–0.5)
Eosinophils Relative: 0 %
HCT: 41.3 % (ref 39.0–52.0)
Hemoglobin: 14.2 g/dL (ref 13.0–17.0)
Immature Granulocytes: 0 %
Lymphocytes Relative: 19 %
Lymphs Abs: 1.7 10*3/uL (ref 0.7–4.0)
MCH: 32.5 pg (ref 26.0–34.0)
MCHC: 34.4 g/dL (ref 30.0–36.0)
MCV: 94.5 fL (ref 80.0–100.0)
Monocytes Absolute: 0.5 10*3/uL (ref 0.1–1.0)
Monocytes Relative: 5 %
Neutro Abs: 6.7 10*3/uL (ref 1.7–7.7)
Neutrophils Relative %: 76 %
Platelets: 192 10*3/uL (ref 150–400)
RBC: 4.37 MIL/uL (ref 4.22–5.81)
RDW: 11.9 % (ref 11.5–15.5)
WBC: 8.9 10*3/uL (ref 4.0–10.5)
nRBC: 0 % (ref 0.0–0.2)

## 2018-08-11 LAB — CBC
HCT: 41.2 % (ref 39.0–52.0)
Hemoglobin: 14.1 g/dL (ref 13.0–17.0)
MCH: 32.1 pg (ref 26.0–34.0)
MCHC: 34.2 g/dL (ref 30.0–36.0)
MCV: 93.8 fL (ref 80.0–100.0)
Platelets: 187 10*3/uL (ref 150–400)
RBC: 4.39 MIL/uL (ref 4.22–5.81)
RDW: 11.9 % (ref 11.5–15.5)
WBC: 9 10*3/uL (ref 4.0–10.5)
nRBC: 0 % (ref 0.0–0.2)

## 2018-08-11 LAB — BASIC METABOLIC PANEL
Anion gap: 9 (ref 5–15)
BUN: 21 mg/dL (ref 8–23)
CO2: 25 mmol/L (ref 22–32)
Calcium: 8.8 mg/dL — ABNORMAL LOW (ref 8.9–10.3)
Chloride: 102 mmol/L (ref 98–111)
Creatinine, Ser: 1.32 mg/dL — ABNORMAL HIGH (ref 0.61–1.24)
GFR calc Af Amer: >60 mL/min (ref >60)
GFR calc non Af Amer: 54 mL/min — ABNORMAL LOW (ref >60)
Glucose, Bld: 120 mg/dL — ABNORMAL HIGH (ref 70–99)
Potassium: 4.1 mmol/L (ref 3.5–5.1)
Sodium: 136 mmol/L (ref 135–145)

## 2018-08-11 LAB — MAGNESIUM
Magnesium: 2.2 mg/dL (ref 1.7–2.4)
Magnesium: 2.3 mg/dL (ref 1.7–2.4)

## 2018-08-11 LAB — PHOSPHORUS: Phosphorus: 4.2 mg/dL (ref 2.5–4.6)

## 2018-08-11 LAB — TROPONIN I (HIGH SENSITIVITY)
Troponin I (High Sensitivity): 5 ng/L (ref <18)
Troponin I (High Sensitivity): 5 ng/L (ref <18)

## 2018-08-11 LAB — SARS CORONAVIRUS 2 BY RT PCR (HOSPITAL ORDER, PERFORMED IN ~~LOC~~ HOSPITAL LAB): SARS Coronavirus 2: NEGATIVE

## 2018-08-11 IMAGING — CT CT HEAD WITHOUT CONTRAST
4 of 8 series · 13 of 47 positions shown, 14 images · non-contrast
Comparison: CT of the head performed 05/19/2015
COMPARISON: CT of the head performed 05/19/2015

Addendum:
CLINICAL DATA: Status post fall off stool, with possible syncopal
episode. Small laceration above the right orbit. Concern for head or
cervical spine injury. Initial encounter.

EXAM:
CT HEAD WITHOUT CONTRAST
CT CERVICAL SPINE WITHOUT CONTRAST
TECHNIQUE: Multidetector CT imaging of the head and cervical spine was
performed following the standard protocol without intravenous
contrast. Multiplanar CT image reconstructions of the cervical spine
were also generated.

[Series 4: head wo · axial · 0.43mm/px · z∈[-148,-88]mm · 2 of 38 slices shown, 3 images]
[im 13/38  brain]
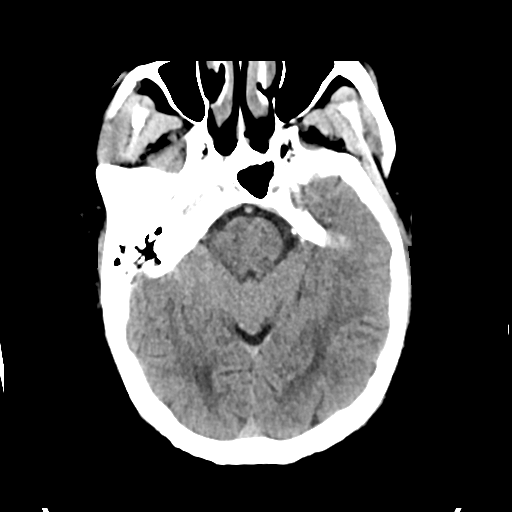
[im 13/38  bone]
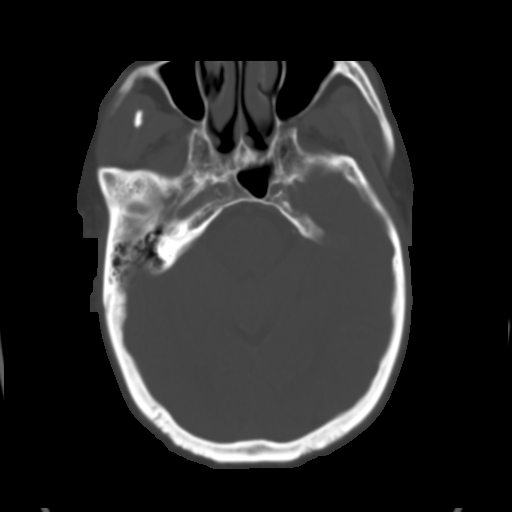
[im 25/38  brain]
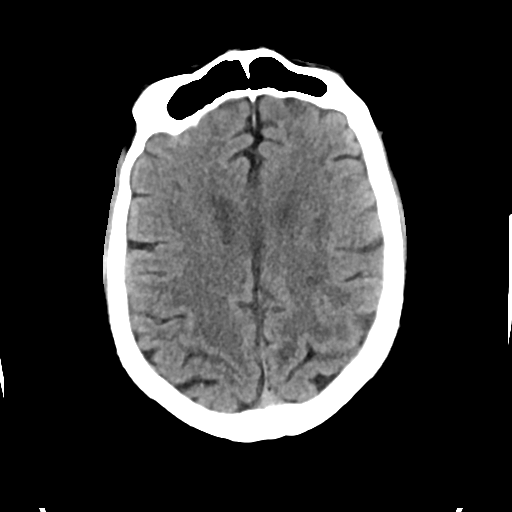

[Series 7: sag soft · sagittal · 0.36mm/px · 2 of 67 slices shown]
[im 23/67  brain]
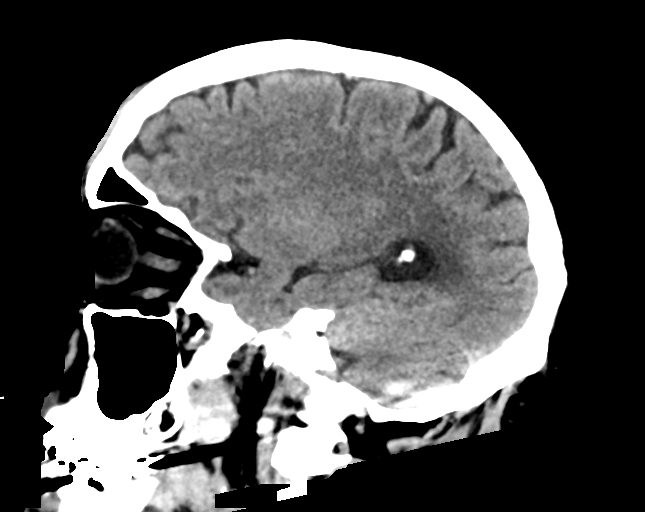
[im 45/67  brain]
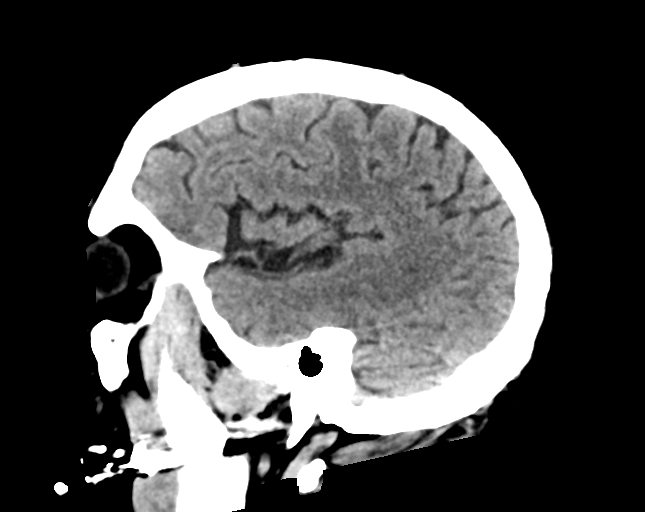

[Series 11: cor bone · coronal · 0.32mm/px · 3 of 67 slices shown]
[im 19/67  brain]
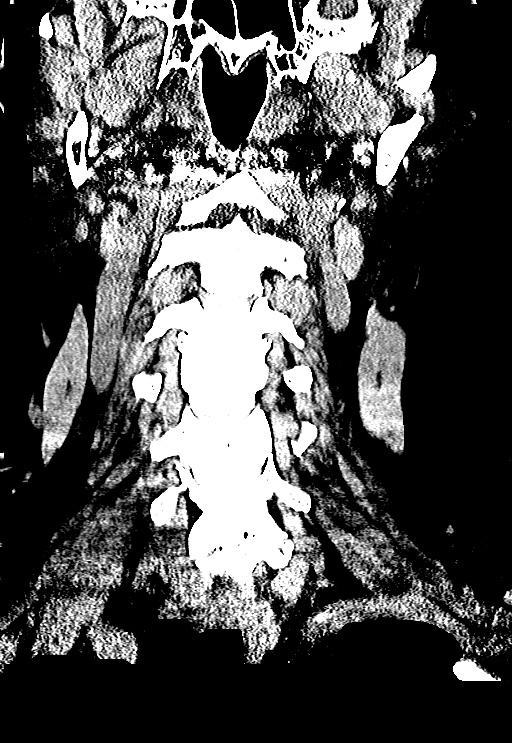
[im 29/67  brain]
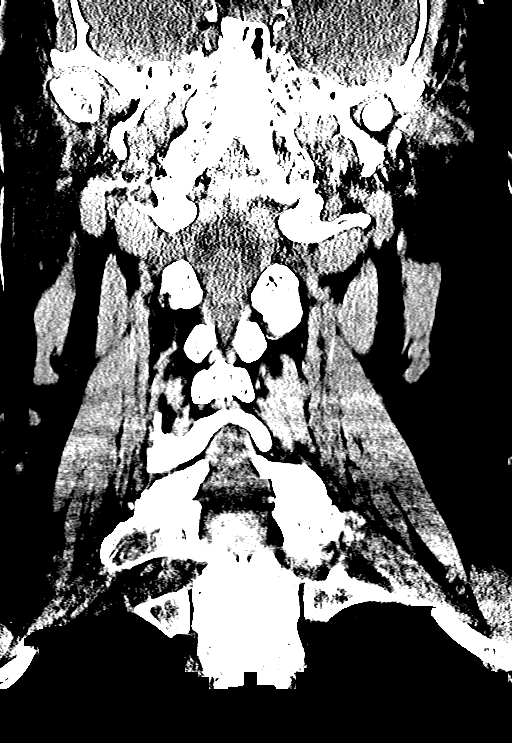
[im 38/67  brain]
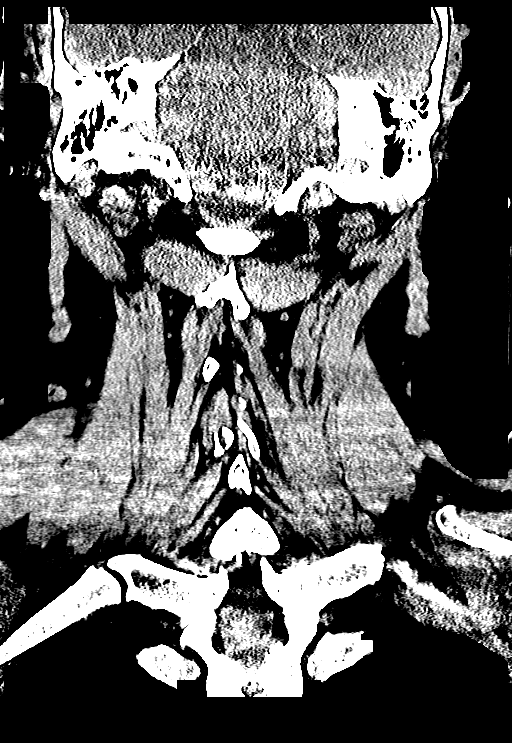

[Series 12: orthogonal axials · axial · 0.21mm/px · z∈[-334,-206]mm · 6 of 89 slices shown]
[im 13/89  brain]
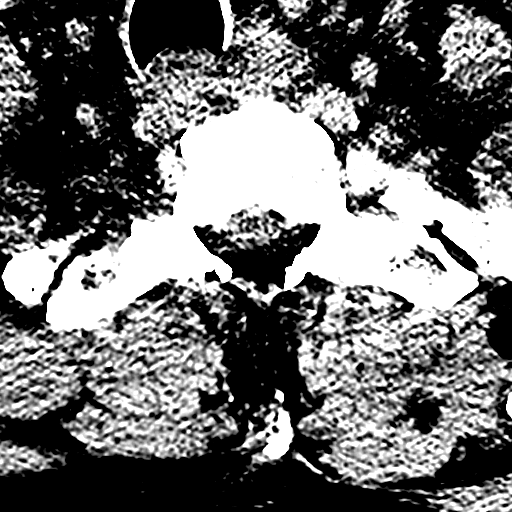
[im 26/89  brain]
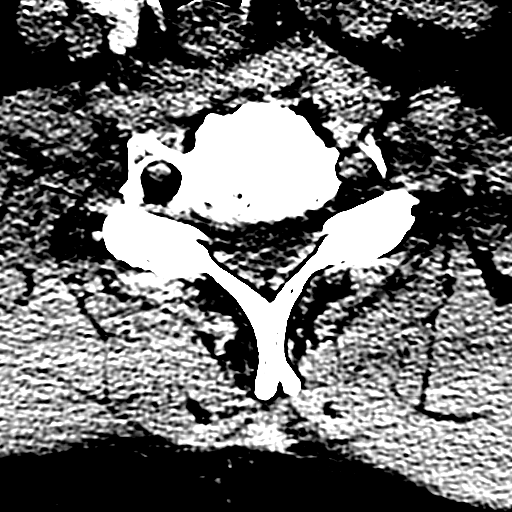
[im 38/89  brain]
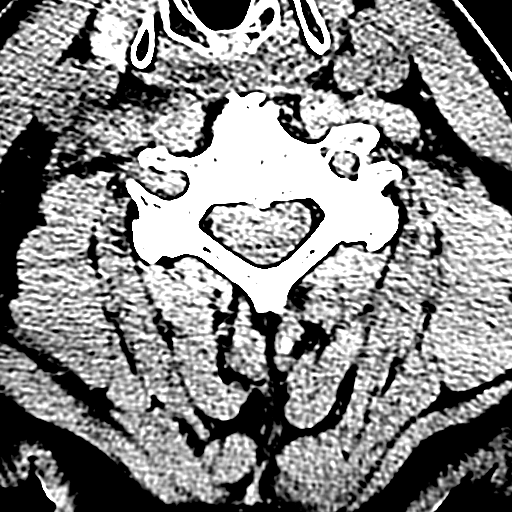
[im 51/89  brain]
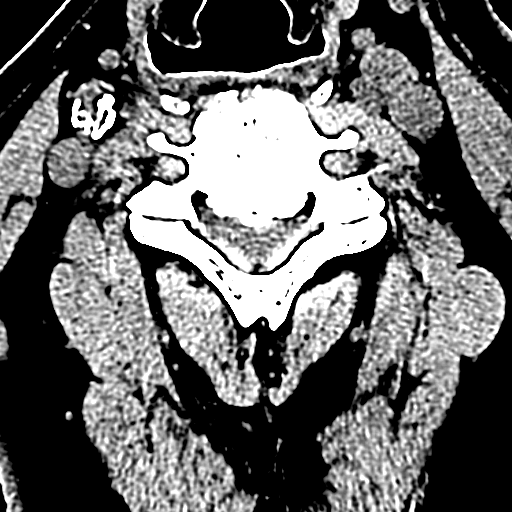
[im 63/89  brain]
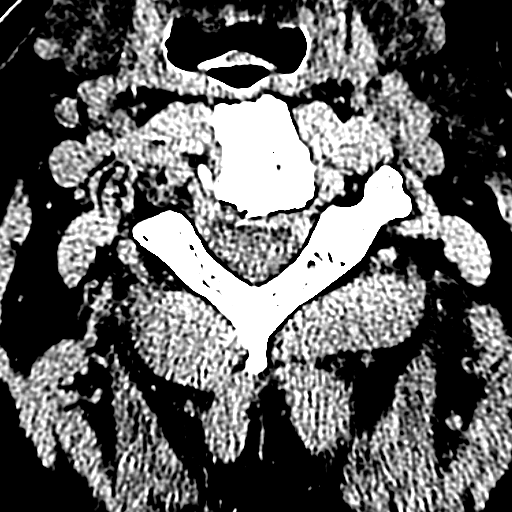
[im 76/89  brain]
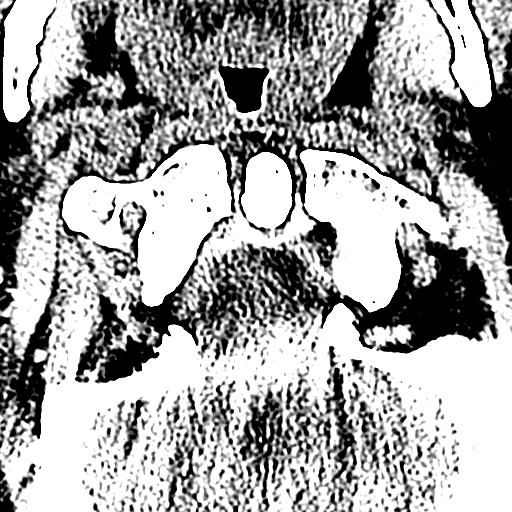

[13 of 47 positions shown; findings below may reference images not displayed]

FINDINGS: CT HEAD FINDINGS

Brain: No evidence of acute infarction, hemorrhage, hydrocephalus,
extra-axial collection or mass lesion / mass effect.

Mild periventricular and subcortical white matter change likely
reflects small vessel ischemic microangiopathy. A likely small
chronic lacunar infarct is noted superior to the right caudate, new
from 5618.

The brainstem and fourth ventricle are within normal limits. The
basal ganglia are unremarkable in appearance. The cerebral
hemispheres demonstrate grossly normal gray-white differentiation.
No mass effect or midline shift is seen.

Vascular: No hyperdense vessel or unexpected calcification.

Skull: There is no evidence of fracture; visualized osseous
structures are unremarkable in appearance.

Sinuses/Orbits: The visualized portions of the orbits are within
normal limits. The paranasal sinuses and mastoid air cells are
well-aerated.

Other: Soft tissue swelling is noted overlying the right frontal
calvarium.

CT CERVICAL SPINE FINDINGS

Alignment: Normal.

Skull base and vertebrae: No acute fracture. No primary bone lesion
or focal pathologic process.

Soft tissues and spinal canal: No prevertebral fluid or swelling. No
visible canal hematoma.

Disc levels: Intervertebral disc space narrowing is noted at C3-C4
and at the lower cervical spine, with scattered anterior and
posterior disc osteophyte complexes.

Upper chest: The visualized lung apices are clear. The thyroid gland
is unremarkable.

Other: Dense calcification is seen at the carotid bifurcations,
particularly on the right, with likely mild luminal narrowing.
IMPRESSION: 1. No evidence of traumatic intracranial injury or fracture.
2. Soft tissue swelling overlying the right frontal calvarium.
3. Mild small vessel ischemic microangiopathy.
4. Mild degenerative change along the cervical spine.
5. Dense calcification at the carotid bifurcations, particularly on
the right, with likely mild luminal narrowing. Carotid ultrasound
would be helpful for further evaluation, when and as deemed
clinically appropriate.

ADDENDUM:
Note is made of a small fracture through the right side of the
posterior spinous process of C5. This is a stable fracture.

These results were called by telephone at the time of interpretation
on 08/11/2018 at [DATE] to Dr. JEFER GIIL, who verbally
acknowledged these results.

*** End of Addendum ***
FINDINGS: CT HEAD FINDINGS

Brain: No evidence of acute infarction, hemorrhage, hydrocephalus,
extra-axial collection or mass lesion / mass effect.

Mild periventricular and subcortical white matter change likely
reflects small vessel ischemic microangiopathy. A likely small
chronic lacunar infarct is noted superior to the right caudate, new
from 5618.

The brainstem and fourth ventricle are within normal limits. The
basal ganglia are unremarkable in appearance. The cerebral
hemispheres demonstrate grossly normal gray-white differentiation.
No mass effect or midline shift is seen.

Vascular: No hyperdense vessel or unexpected calcification.

Skull: There is no evidence of fracture; visualized osseous
structures are unremarkable in appearance.

Sinuses/Orbits: The visualized portions of the orbits are within
normal limits. The paranasal sinuses and mastoid air cells are
well-aerated.

Other: Soft tissue swelling is noted overlying the right frontal
calvarium.

CT CERVICAL SPINE FINDINGS

Alignment: Normal.

Skull base and vertebrae: No acute fracture. No primary bone lesion
or focal pathologic process.

Soft tissues and spinal canal: No prevertebral fluid or swelling. No
visible canal hematoma.

Disc levels: Intervertebral disc space narrowing is noted at C3-C4
and at the lower cervical spine, with scattered anterior and
posterior disc osteophyte complexes.

Upper chest: The visualized lung apices are clear. The thyroid gland
is unremarkable.

Other: Dense calcification is seen at the carotid bifurcations,
particularly on the right, with likely mild luminal narrowing.
IMPRESSION: 1. No evidence of traumatic intracranial injury or fracture.
2. Soft tissue swelling overlying the right frontal calvarium.
3. Mild small vessel ischemic microangiopathy.
4. Mild degenerative change along the cervical spine.
5. Dense calcification at the carotid bifurcations, particularly on
the right, with likely mild luminal narrowing. Carotid ultrasound
would be helpful for further evaluation, when and as deemed
clinically appropriate.

## 2018-08-11 MED ORDER — AMLODIPINE BESYLATE 2.5 MG PO TABS: 2.5000 mg | ORAL_TABLET | Freq: Every day | ORAL | Status: DC

## 2018-08-11 MED ORDER — TETANUS-DIPHTH-ACELL PERTUSSIS 5-2.5-18.5 LF-MCG/0.5 IM SUSP
0.5000 mL | Freq: Once | INTRAMUSCULAR | Status: AC
  Administered 2018-08-11: 0.5 mL via INTRAMUSCULAR
  Filled 2018-08-11: qty 0.5

## 2018-08-11 MED ORDER — ENOXAPARIN SODIUM 40 MG/0.4ML ~~LOC~~ SOLN
40.0000 mg | SUBCUTANEOUS | Status: DC
  Administered 2018-08-11: 40 mg via SUBCUTANEOUS
  Filled 2018-08-11: qty 0.4

## 2018-08-11 MED ORDER — FENTANYL CITRATE (PF) 100 MCG/2ML IJ SOLN
75.0000 ug | Freq: Once | INTRAMUSCULAR | Status: AC
  Administered 2018-08-11: 75 ug via INTRAVENOUS
  Filled 2018-08-11: qty 2

## 2018-08-11 MED ORDER — LIDOCAINE HCL (PF) 1 % IJ SOLN
5.0000 mL | Freq: Once | INTRAMUSCULAR | Status: AC
  Administered 2018-08-11: 5 mL via INTRADERMAL
  Filled 2018-08-11: qty 5

## 2018-08-11 MED ORDER — ASPIRIN EC 81 MG PO TBEC
81.0000 mg | DELAYED_RELEASE_TABLET | Freq: Every day | ORAL | Status: DC
  Administered 2018-08-12: 81 mg via ORAL
  Filled 2018-08-11: qty 1

## 2018-08-11 NOTE — Consult Note (Signed)
Chief Complaint   Chief Complaint  Patient presents with   Fall    HPI   Consult requested by: Dr Alroy Bailiff Reason for consult: C5 spinous process fracture  HPI: [name] is a 73 y.o. male who presented to ED after a syncopal episode. He has a history of dementia and unable to provide any significant history. Wife is at bedside assisting. At approx 2:45pm, they were having lunch and she noticed a strange look on his face. He subsequently fell and landed face first on ground and on hit right shoulder. No witnessed seizure like activity. Did defecate on self. Daughter who is a nurse came over and recommended he come to ED for evaluation. Work up performed by EDP included CT C spine which showed a C5 spinous process fracture. NSY consultation requested.   At the time of my evaluation, patient is resting comfortably. Told his wife 5-10 min prior to my arrival he was having right shoulder pain and posterior neck pain, but denies any pain currently. He is not exactly sure why he is here. At baseline, he is oriented to self and wife, but not year. He needs assistance with all ADLs. Wife tells me if he were to ever need neurosurgical intervention, they would not pursue it.   Patient Active Problem List   Diagnosis Date Noted   Memory loss    Pyrexia    Chest pain 05/19/2015   Headache 05/19/2015   Pulsatile abdominal mass 05/17/2012   Hyperlipidemia 11/09/2011   Sinus bradycardia 11/09/2011   HTN (hypertension) 09/18/2010   CAD (coronary artery disease) 09/18/2010    PMH: Past Medical History:  Diagnosis Date   Arthritis    Chest pain    Colon polyp    Coronary artery disease    post PTCA and stent of the distal RCA. He has a 3.0 mm espress 2 stent deployed at 14 atmospheres placed in [place], [place]             Dyslipidemia    Erectile dysfunction    GERD (gastroesophageal reflux disease)    Headache    Hyperlipidemia    Hypertension    Kidney  stones    history    Memory loss    Skin cancer 2015    PSH: Past Surgical History:  Procedure Laterality Date   BACK SURGERY     CAROTID ENDARTERECTOMY     COLONOSCOPY     CORONARY ANGIOPLASTY WITH STENT PLACEMENT     post PTCA and stent of the distal RCA. He has a 3.0 mm espress 2 stent deployed at 14 atmospheres placed in [place], [place]  06/2014   EYE SURGERY     HERNIA REPAIR     JOINT REPLACEMENT     left knee   POLYPECTOMY     TOTAL KNEE ARTHROPLASTY     left    (Not in a hospital admission)   SH: Social History   Tobacco Use   Smoking status: Never Smoker   Smokeless tobacco: Former Systems developer    Types: Chew  Substance Use Topics   Alcohol use: Yes    Comment: 0.6 oz per week    Drug use: No    MEDS: Prior to Admission medications   Medication Sig Start Date End Date Taking? Authorizing Provider  amLODipine (NORVASC) 2.5 MG tablet Take 2.5 mg by mouth at bedtime. 05/15/15  [provider]  aspirin EC 81 MG tablet Take 81 mg by mouth daily.    [provider]  benazepril (LOTENSIN) 10 MG tablet Take 10 mg by mouth at bedtime. 05/15/15   [provider]  celecoxib (CELEBREX) 200 MG capsule Take 200 mg by mouth daily.      [provider]  citalopram (CELEXA) 20 MG tablet Take 20 mg by mouth daily.    [provider]  lansoprazole (PREVACID) 15 MG capsule Take 15 mg by mouth daily at 12 noon.    [provider]  memantine (NAMENDA) 10 MG tablet Take 2 tablets by mouth daily. 06/23/17   [provider]  Misc Natural Products (OSTEO BI-FLEX JOINT SHIELD) TABS Take 1 tablet by mouth daily.      [provider]  Multiple Vitamin (MULTIVITAMIN WITH MINERALS) TABS tablet Take 1 tablet by mouth daily.    [provider]  Omega-3 Fatty Acids (FISH OIL) 1200 MG CAPS Take 1,200 mg by mouth daily.     [provider]  rivastigmine  (EXELON) 4.6 mg/24hr Place 4.6 mg onto the skin daily. 03/19/15   [provider]  rosuvastatin (CRESTOR) 10 MG tablet Take 1 tablet (10 mg total) by mouth daily. 01/24/18 01/19/19  Nahser, Wonda Cheng, MD  Saw Palmetto, Serenoa repens, (SAW PALMETTO PO) Take 1 capsule by mouth at bedtime.     [provider]  vitamin C (ASCORBIC ACID) 500 MG tablet Take 500 mg by mouth at bedtime.     [provider]    ALLERGY: Allergies  Allergen Reactions   Oxycodone-Acetaminophen Other (See Comments)    Pt saw bugs    Social History   Tobacco Use   Smoking status: Never Smoker   Smokeless tobacco: Former Systems developer    Types: Chew  Substance Use Topics   Alcohol use: Yes    Comment: 0.6 oz per week      Family History  Problem Relation Age of Onset   Aortic aneurysm Father    Cancer Father        colon    Coronary artery disease Mother        with CABG   Alzheimer's disease Mother    Hypertension Brother      ROS   ROS unable to obtain, dementia  Exam   Vitals:   08/11/18 1815 08/11/18 1830  BP: 121/69 121/69  Pulse:  (!) 49  Resp: 12 11  Temp:    SpO2:  96%   General appearance: elderly male, resting comfortably, immobilized in C collar Eyes: No scleral injection Cardiovascular: Regular rate and rhythm without murmurs, rubs, gallops. No edema or variciosities. Distal pulses normal. Pulmonary: Effort normal, non-labored breathing Musculoskeletal:     Muscle tone upper extremities: Normal    Muscle tone lower extremities: Normal    Motor exam: Upper Extremities Deltoid Bicep Tricep Grip  Right 4+/5 5/5 5/5 5/5  Left 5/5 5/5 5/5 5/5   Lower Extremity IP Quad PF DF EHL  Right 5/5 5/5 5/5 5/5 5/5  Left 5/5 5/5 5/5 5/5 5/5   Neurological Mental Status:    - Patient is awake, alert, oriented to self but not location/year    - No signs of aphasia or neglect Cranial Nerves    - II: Visual Fields are full. PERRL    - III/IV/VI: EOMI without  ptosis or diploplia.     - V: Facial sensation is grossly normal    -  VII: Facial movement is symmetric.     - VIII: hearing is intact to voice    - X: Uvula elevates symmetrically    - XI: Shoulder shrug is symmetric.    - XII: tongue is midline without atrophy or fasciculations.  Sensory: Sensation grossly intact to LT  Results - Imaging/Labs   Results for orders placed or performed during the hospital encounter of 08/11/18 (from the past 48 hour(s))  Basic metabolic panel     Status: Abnormal   Collection Time: 08/11/18  4:38 PM  Result Value Ref Range   Sodium 136 135 - 145 mmol/L   Potassium 4.1 3.5 - 5.1 mmol/L   Chloride 102 98 - 111 mmol/L   CO2 25 22 - 32 mmol/L   Glucose, Bld 120 (H) 70 - 99 mg/dL   BUN 21 8 - 23 mg/dL   Creatinine, Ser 1.32 (H) 0.61 - 1.24 mg/dL   Calcium 8.8 (L) 8.9 - 10.3 mg/dL   GFR calc non Af Amer 54 (L) >60 mL/min   GFR calc Af Amer >60 >60 mL/min   Anion gap 9 5 - 15    Comment: Performed at [facility], [place] [address], [place], [place] [address]  CBC WITH DIFFERENTIAL     Status: None   Collection Time: 08/11/18  4:38 PM  Result Value Ref Range   WBC 8.9 4.0 - 10.5 K/uL   RBC 4.37 4.22 - 5.81 MIL/uL   Hemoglobin 14.2 13.0 - 17.0 g/dL   HCT 41.3 39.0 - 52.0 %   MCV 94.5 80.0 - 100.0 fL   MCH 32.5 26.0 - 34.0 pg   MCHC 34.4 30.0 - 36.0 g/dL   RDW 11.9 11.5 - 15.5 %   Platelets 192 150 - 400 K/uL   nRBC 0.0 0.0 - 0.2 %   Neutrophils Relative % 76 %   Neutro Abs 6.7 1.7 - 7.7 K/uL   Lymphocytes Relative 19 %   Lymphs Abs 1.7 0.7 - 4.0 K/uL   Monocytes Relative 5 %   Monocytes Absolute 0.5 0.1 - 1.0 K/uL   Eosinophils Relative 0 %   Eosinophils Absolute 0.0 0.0 - 0.5 K/uL   Basophils Relative 0 %   Basophils Absolute 0.0 0.0 - 0.1 K/uL   Immature Granulocytes 0 %   Abs Immature Granulocytes 0.02 0.00 - 0.07 K/uL    Comment: Performed at [facility], [address], [place], [place] [address]  Troponin I (High  Sensitivity)     Status: None   Collection Time: 08/11/18  4:38 PM  Result Value Ref Range   Troponin I (High Sensitivity) 5 <18 ng/L    Comment: (NOTE) Elevated high sensitivity troponin I (hsTnI) values and significant  changes across serial measurements may suggest ACS but many other  chronic and acute conditions are known to elevate hsTnI results.  Refer to the "Links" section for chest pain algorithms and additional  guidance. Performed at [facility], [place] [address], [place], [place] [address]   Magnesium     Status: None   Collection Time: 08/11/18  5:12 PM  Result Value Ref Range   Magnesium 2.2 1.7 - 2.4 mg/dL    Comment: Performed at [place] [address], [place], [place] [address]  SARS Coronavirus 2 (CEPHEID - Performed in [facility] hospital lab), Hosp Order     Status: None   Collection Time: 08/11/18  5:53 PM   Specimen: Nasopharyngeal  Swab  Result Value Ref Range   SARS Coronavirus 2 NEGATIVE NEGATIVE    Comment: (NOTE) If result is NEGATIVE SARS-CoV-2 target nucleic acids are NOT DETECTED. The SARS-CoV-2 RNA is generally detectable in upper and lower  respiratory specimens during the acute phase of infection. The lowest  concentration of SARS-CoV-2 viral copies this assay can detect is 250  copies / mL. A negative result does not preclude SARS-CoV-2 infection  and should not be used as the sole basis for treatment or other  patient management decisions.  A negative result may occur with  improper specimen collection / handling, submission of specimen other  than nasopharyngeal swab, presence of viral mutation(s) within the  areas targeted by this assay, and inadequate number of viral copies  (<250 copies / mL). A negative result must be combined with clinical  observations, patient history, and epidemiological information. If result is POSITIVE SARS-CoV-2 target nucleic acids are DETECTED. The SARS-CoV-2 RNA is generally detectable in  upper and lower  respiratory specimens dur ing the acute phase of infection.  Positive  results are indicative of active infection with SARS-CoV-2.  Clinical  correlation with patient history and other diagnostic information is  necessary to determine patient infection status.  Positive results do  not rule out bacterial infection or co-infection with other viruses. If result is PRESUMPTIVE POSTIVE SARS-CoV-2 nucleic acids MAY BE PRESENT.   A presumptive positive result was obtained on the submitted specimen  and confirmed on repeat testing.  While 2019 novel coronavirus  (SARS-CoV-2) nucleic acids may be present in the submitted sample  additional confirmatory testing may be necessary for epidemiological  and / or clinical management purposes  to differentiate between  SARS-CoV-2 and other Sarbecovirus currently known to infect humans.  If clinically indicated additional testing with an alternate test  methodology [phone]) is advised. The SARS-CoV-2 RNA is generally  detectable in upper and lower respiratory sp ecimens during the acute  phase of infection. The expected result is Negative. Fact Sheet for Patients:  StrictlyIdeas.no Fact Sheet for Healthcare Providers: BankingDealers.co.za This test is not yet approved or cleared by the [place] FDA and has been authorized for detection and/or diagnosis of SARS-CoV-2 by FDA under an Emergency Use Authorization (EUA).  This EUA will remain in effect (meaning this test can be used) for the duration of the COVID-19 declaration under Section 564(b)(1) of the Act, 21 U.S.C. section 360bbb-3(b)(1), unless the authorization is terminated or revoked sooner. Performed at [facility], [place] [address] [place], [place] [address]   Troponin I (High Sensitivity)     Status: None   Collection Time: 08/11/18  6:30 PM  Result Value Ref Range   Troponin I (High Sensitivity) 5 <18 ng/L     Comment: (NOTE) Elevated high sensitivity troponin I (hsTnI) values and significant  changes across serial measurements may suggest ACS but many other  chronic and acute conditions are known to elevate hsTnI results.  Refer to the "Links" section for chest pain algorithms and additional  guidance. Performed at [facility], [place] [address], [place], [place] [address]     Ct Head Wo Contrast  Addendum Date: 08/11/2018   ADDENDUM REPORT: 08/11/2018 18:13 ADDENDUM: Note is made of a small fracture through the right side of the posterior spinous process of C5. This is a stable fracture. These results were called by telephone at the time of interpretation on 08/11/2018 at 6:11 pm to Dr. Alroy Bailiff, who verbally acknowledged  these results. Electronically Signed   By: Garald Balding M.D.   On: 08/11/2018 18:13   Result Date: 08/11/2018 CLINICAL DATA:  Status post fall off stool, with possible syncopal episode. Small laceration above the right orbit. Concern for head or cervical spine injury. Initial encounter. EXAM: CT HEAD WITHOUT CONTRAST CT CERVICAL SPINE WITHOUT CONTRAST TECHNIQUE: Multidetector CT imaging of the head and cervical spine was performed following the standard protocol without intravenous contrast. Multiplanar CT image reconstructions of the cervical spine were also generated. COMPARISON:  CT of the head performed 05/19/2015 FINDINGS: CT HEAD FINDINGS Brain: No evidence of acute infarction, hemorrhage, hydrocephalus, extra-axial collection or mass lesion / mass effect. Mild periventricular and subcortical white matter change likely reflects small vessel ischemic microangiopathy. A likely small chronic lacunar infarct is noted superior to the right caudate, new from 2017. The brainstem and fourth ventricle are within normal limits. The basal ganglia are unremarkable in appearance. The cerebral hemispheres demonstrate grossly normal gray-white differentiation. No mass effect or midline  shift is seen. Vascular: No hyperdense vessel or unexpected calcification. Skull: There is no evidence of fracture; visualized osseous structures are unremarkable in appearance. Sinuses/Orbits: The visualized portions of the orbits are within normal limits. The paranasal sinuses and mastoid air cells are well-aerated. Other: Soft tissue swelling is noted overlying the right frontal calvarium. CT CERVICAL SPINE FINDINGS Alignment: Normal. Skull base and vertebrae: No acute fracture. No primary bone lesion or focal pathologic process. Soft tissues and spinal canal: No prevertebral fluid or swelling. No visible canal hematoma. Disc levels: Intervertebral disc space narrowing is noted at C3-C4 and at the lower cervical spine, with scattered anterior and posterior disc osteophyte complexes. Upper chest: The visualized lung apices are clear. The thyroid gland is unremarkable. Other: Dense calcification is seen at the carotid bifurcations, particularly on the right, with likely mild luminal narrowing. IMPRESSION: 1. No evidence of traumatic intracranial injury or fracture. 2. Soft tissue swelling overlying the right frontal calvarium. 3. Mild small vessel ischemic microangiopathy. 4. Mild degenerative change along the cervical spine. 5. Dense calcification at the carotid bifurcations, particularly on the right, with likely mild luminal narrowing. Carotid ultrasound would be helpful for further evaluation, when and as deemed clinically appropriate. Electronically Signed: By: Garald Balding M.D. On: 08/11/2018 18:02   Ct Cervical Spine Wo Contrast  Addendum Date: 08/11/2018   ADDENDUM REPORT: 08/11/2018 18:13 ADDENDUM: Note is made of a small fracture through the right side of the posterior spinous process of C5. This is a stable fracture. These results were called by telephone at the time of interpretation on 08/11/2018 at 6:11 pm to Dr. Alroy Bailiff, who verbally acknowledged these results. Electronically Signed   By:  Garald Balding M.D.   On: 08/11/2018 18:13   Result Date: 08/11/2018 CLINICAL DATA:  Status post fall off stool, with possible syncopal episode. Small laceration above the right orbit. Concern for head or cervical spine injury. Initial encounter. EXAM: CT HEAD WITHOUT CONTRAST CT CERVICAL SPINE WITHOUT CONTRAST TECHNIQUE: Multidetector CT imaging of the head and cervical spine was performed following the standard protocol without intravenous contrast. Multiplanar CT image reconstructions of the cervical spine were also generated. COMPARISON:  CT of the head performed 05/19/2015 FINDINGS: CT HEAD FINDINGS Brain: No evidence of acute infarction, hemorrhage, hydrocephalus, extra-axial collection or mass lesion / mass effect. Mild periventricular and subcortical white matter change likely reflects small vessel ischemic microangiopathy. A likely small chronic lacunar infarct is noted superior to the right caudate,  new from 2017. The brainstem and fourth ventricle are within normal limits. The basal ganglia are unremarkable in appearance. The cerebral hemispheres demonstrate grossly normal gray-white differentiation. No mass effect or midline shift is seen. Vascular: No hyperdense vessel or unexpected calcification. Skull: There is no evidence of fracture; visualized osseous structures are unremarkable in appearance. Sinuses/Orbits: The visualized portions of the orbits are within normal limits. The paranasal sinuses and mastoid air cells are well-aerated. Other: Soft tissue swelling is noted overlying the right frontal calvarium. CT CERVICAL SPINE FINDINGS Alignment: Normal. Skull base and vertebrae: No acute fracture. No primary bone lesion or focal pathologic process. Soft tissues and spinal canal: No prevertebral fluid or swelling. No visible canal hematoma. Disc levels: Intervertebral disc space narrowing is noted at C3-C4 and at the lower cervical spine, with scattered anterior and posterior disc osteophyte  complexes. Upper chest: The visualized lung apices are clear. The thyroid gland is unremarkable. Other: Dense calcification is seen at the carotid bifurcations, particularly on the right, with likely mild luminal narrowing. IMPRESSION: 1. No evidence of traumatic intracranial injury or fracture. 2. Soft tissue swelling overlying the right frontal calvarium. 3. Mild small vessel ischemic microangiopathy. 4. Mild degenerative change along the cervical spine. 5. Dense calcification at the carotid bifurcations, particularly on the right, with likely mild luminal narrowing. Carotid ultrasound would be helpful for further evaluation, when and as deemed clinically appropriate. Electronically Signed: By: Garald Balding M.D. On: 08/11/2018 18:02   Dg Chest Portable 1 View  Result Date: 08/11/2018 CLINICAL DATA:  Syncope and collapse, fell off a bar stool onto floor, BILATERAL shoulder pain, dementia, coronary artery disease, hypertension EXAM: PORTABLE CHEST 1 VIEW COMPARISON:  Portable exam 1653 hours compared to 05/19/2015 FINDINGS: Normal heart size post median sternotomy. Mediastinal contours and pulmonary vascularity normal. Lungs clear. No infiltrate, pleural effusion or pneumothorax. Bones unremarkable. IMPRESSION: No acute abnormalities. Electronically Signed   By: Lavonia Dana M.D.   On: 08/11/2018 18:09   Dg Shoulder Left Port  Result Date: 08/11/2018 CLINICAL DATA:  Fall EXAM: LEFT SHOULDER - 1 VIEW COMPARISON:  Portable exam 1653 hours without priors for comparison FINDINGS: Mild degenerative changes at LEFT [facility] joint. Osseous demineralization. No fracture, dislocation or bone destruction seen on single AP view. IMPRESSION: No acute osseous abnormalities. Mild degenerative changes of the LEFT AC joint. Electronically Signed   By: Lavonia Dana M.D.   On: 08/11/2018 18:20   Dg Shoulder Right Port  Result Date: 08/11/2018 CLINICAL DATA:  Fall EXAM: PORTABLE RIGHT SHOULDER COMPARISON:  Portable exam 4580  hours without priors for comparison FINDINGS: Osseous demineralization. AC joint alignment normal. No fracture, dislocation or bone destruction identified on single AP view. IMPRESSION: No acute abnormalities. Electronically Signed   By: Lavonia Dana M.D.   On: 08/11/2018 18:20   Impression/Plan   73 y.o. male with C5 spinous process fracture after a syncopal episode. With exception of mild pain mediated weakness right deltoid, he appears to have grossly normal strength in BUE and BLE. Per wife, he is at neurologic baseline. This spinous fracture is a stable fracture and does not require any surgical intervention. He does not need any cervical immobilization, although a soft collar can be used for symptomatic relief. Discussed with wife that there is no need for NS follow up, but we would be happy to see him as necessary. Patient is going to be admitted under [facility] for further work up and management. Please call for any concerns.  Ferne Reus,  PA-C [facility]

## 2018-08-11 NOTE — ED Provider Notes (Signed)
[facility] EMERGENCY DEPARTMENT Provider Note   CSN: [id number] Arrival date & time: 08/11/18  1609     History   Chief Complaint Chief Complaint  Patient presents with   Fall    HPI [name] is a 73 y.o. male.     HPI Patient is a 73 year old male with past medical history of CAD s/p triple bypass, HTN, HLD, chronic sinus bradycardia and advanced dementia who presents to the emergency department today via EMS after he had a syncopal episode at home.  He is accompanied by his wife who provides additional history.  Patient's wife states the patient was sitting up on a stool and there kitchen when he all of a sudden had a strange look on his face and fell over and hit his face on the ground.   Additional history provided by the patient is difficult as he does not know exactly why he is here or where here is or what year it is, his wife at the bedside states this is normal for him with his level of advanced dementia.  He is complaining of pain to bilateral shoulders.   Past Medical History:  Diagnosis Date   Arthritis    Chest pain    Colon polyp    Coronary artery disease    post PTCA and stent of the distal RCA. He has a 3.0 mm espress 2 stent deployed at 14 atmospheres placed in [place], [place]             Dyslipidemia    Erectile dysfunction    GERD (gastroesophageal reflux disease)    Headache    Hyperlipidemia    Hypertension    Kidney stones    history    Memory loss    Skin cancer 2015    Patient Active Problem List   Diagnosis Date Noted   Memory loss    Pyrexia    Chest pain 05/19/2015   Headache 05/19/2015   Pulsatile abdominal mass 05/17/2012   Hyperlipidemia 11/09/2011   Sinus bradycardia 11/09/2011   HTN (hypertension) 09/18/2010   CAD (coronary artery disease) 09/18/2010    Past Surgical History:  Procedure Laterality Date   BACK SURGERY     CAROTID ENDARTERECTOMY     COLONOSCOPY     CORONARY  ANGIOPLASTY WITH STENT PLACEMENT     post PTCA and stent of the distal RCA. He has a 3.0 mm espress 2 stent deployed at 14 atmospheres placed in [place], [place]RAFT  06/2014   EYE SURGERY     HERNIA REPAIR     JOINT REPLACEMENT     left knee   POLYPECTOMY     TOTAL KNEE ARTHROPLASTY     left        Home Medications    Prior to Admission medications   Medication Sig Start Date End Date Taking? Authorizing Provider  amLODipine (NORVASC) 2.5 MG tablet Take 2.5 mg by mouth at bedtime. 05/15/15   [provider]  aspirin EC 81 MG tablet Take 81 mg by mouth daily.    [provider]  benazepril (LOTENSIN) 10 MG tablet Take 10 mg by mouth at bedtime. 05/15/15   [provider]  celecoxib (CELEBREX) 200 MG capsule Take 200 mg by mouth daily.      [provider]  citalopram (CELEXA) 20 MG tablet Take 20 mg by mouth daily.  [provider]  lansoprazole (PREVACID) 15 MG capsule Take 15 mg by mouth daily at 12 noon.    [provider]  memantine (NAMENDA) 10 MG tablet Take 2 tablets by mouth daily. 06/23/17   [provider]  Misc Natural Products (OSTEO BI-FLEX JOINT SHIELD) TABS Take 1 tablet by mouth daily.      [provider]  Multiple Vitamin (MULTIVITAMIN WITH MINERALS) TABS tablet Take 1 tablet by mouth daily.    [provider]  Omega-3 Fatty Acids (FISH OIL) 1200 MG CAPS Take 1,200 mg by mouth daily.     [provider]  rivastigmine (EXELON) 4.6 mg/24hr Place 4.6 mg onto the skin daily. 03/19/15   [provider]  rosuvastatin (CRESTOR) 10 MG tablet Take 1 tablet (10 mg total) by mouth daily. 01/24/18 01/19/19  Nahser, Wonda Cheng, MD  Saw Palmetto, Serenoa repens, (SAW PALMETTO PO) Take 1 capsule by mouth at bedtime.     [provider]  vitamin C (ASCORBIC ACID) 500 MG tablet Take 500 mg by mouth at bedtime.     [provider]     Family History Family History  Problem Relation Age of Onset   Aortic aneurysm Father    Cancer Father        colon    Coronary artery disease Mother        with CABG   Alzheimer's disease Mother    Hypertension Brother     Social History Social History   Tobacco Use   Smoking status: Never Smoker   Smokeless tobacco: Former Systems developer    Types: Chew  Substance Use Topics   Alcohol use: Yes    Comment: 0.6 oz per week    Drug use: No     Allergies   Oxycodone-acetaminophen   Review of Systems Review of Systems  Constitutional: Negative for chills and fever.  HENT: Negative for ear pain and sore throat.   Eyes: Negative for pain and visual disturbance.  Respiratory: Negative for cough and shortness of breath.   Cardiovascular: Negative for chest pain and palpitations.  Gastrointestinal: Negative for abdominal pain and vomiting.  Genitourinary: Negative for dysuria and hematuria.  Musculoskeletal: Positive for arthralgias (bilateral shoulders). Negative for back pain, neck pain and neck stiffness.  Skin: Negative for color change and rash.  Neurological: Positive for syncope. Negative for seizures.  All other systems reviewed and are negative.    Physical Exam Updated Vital Signs BP 127/68 (BP Location: Right Arm)    Pulse (!) 50    Temp 97.8 F (36.6 C) (Oral)    Resp 16    SpO2 96%   Physical Exam Vitals signs and nursing note reviewed.  Constitutional:      Appearance: Normal appearance. He is well-developed. He is not ill-appearing.  HENT:     Head: Normocephalic and atraumatic.      Right Ear: External ear normal.     Left Ear: External ear normal.     Nose: Nose normal.     Mouth/Throat:     Mouth: Mucous membranes are moist.  Eyes:     Conjunctiva/sclera: Conjunctivae normal.  Neck:     Musculoskeletal: Neck supple.     Comments: Cervical collar in place Cardiovascular:     Rate and Rhythm: Regular rhythm. Bradycardia present.      Pulses: Normal pulses.     Heart sounds: No murmur.  Pulmonary:     Effort: Pulmonary effort is normal. No respiratory distress.  Breath sounds: Normal breath sounds.  Abdominal:     General: Abdomen is flat.     Palpations: Abdomen is soft.     Tenderness: There is no abdominal tenderness.  Musculoskeletal:     Right shoulder: He exhibits tenderness.     Left shoulder: He exhibits tenderness.     Comments: Tender to palpation over the bilateral anterior shoulders the area of the [facility] joint.  No obvious deformity noted.  Bilateral upper extremities are neurovascularly intact.  Skin:    General: Skin is warm and dry.     Capillary Refill: Capillary refill takes less than 2 seconds.  Neurological:     General: No focal deficit present.     Mental Status: He is alert.      ED Treatments / Results  Labs (all labs ordered are listed, but only abnormal results are displayed) Labs Reviewed  BASIC METABOLIC PANEL - Abnormal; Notable for the following components:      Result Value   Glucose, Bld 120 (*)    Creatinine, Ser 1.32 (*)    Calcium 8.8 (*)    GFR calc non Af Amer 54 (*)    All other components within normal limits  SARS CORONAVIRUS 2 (HOSPITAL ORDER, [place] [facility])  CBC WITH DIFFERENTIAL/PLATELET  MAGNESIUM  CBG MONITORING, ED  TROPONIN I (HIGH SENSITIVITY)  TROPONIN I (HIGH SENSITIVITY)    EKG EKG Interpretation  Date/Time:  Thursday August 11 2018 16:24:20 EDT Ventricular Rate:  49 PR Interval:    QRS Duration: 99 QT Interval:  493 QTC Calculation: 446 R Axis:   43 Text Interpretation:  Sinus bradycardia Posterior infarct, old Minimal ST elevation, inferior leads When compared to prior, no signifiant changes seen.  No STEMI Confirmed by Antony Blackbird [phone]) on 08/11/2018 5:07:32 PM   Radiology Ct Head Wo Contrast  Addendum Date: 08/11/2018   ADDENDUM REPORT: 08/11/2018 18:13 ADDENDUM: Note is made of a small fracture through the  right side of the posterior spinous process of C5. This is a stable fracture. These results were called by telephone at the time of interpretation on 08/11/2018 at 6:11 pm to Dr. Alroy Bailiff, who verbally acknowledged these results. Electronically Signed   By: Garald Balding M.D.   On: 08/11/2018 18:13   Result Date: 08/11/2018 CLINICAL DATA:  Status post fall off stool, with possible syncopal episode. Small laceration above the right orbit. Concern for head or cervical spine injury. Initial encounter. EXAM: CT HEAD WITHOUT CONTRAST CT CERVICAL SPINE WITHOUT CONTRAST TECHNIQUE: Multidetector CT imaging of the head and cervical spine was performed following the standard protocol without intravenous contrast. Multiplanar CT image reconstructions of the cervical spine were also generated. COMPARISON:  CT of the head performed 05/19/2015 FINDINGS: CT HEAD FINDINGS Brain: No evidence of acute infarction, hemorrhage, hydrocephalus, extra-axial collection or mass lesion / mass effect. Mild periventricular and subcortical white matter change likely reflects small vessel ischemic microangiopathy. A likely small chronic lacunar infarct is noted superior to the right caudate, new from 2017. The brainstem and fourth ventricle are within normal limits. The basal ganglia are unremarkable in appearance. The cerebral hemispheres demonstrate grossly normal gray-white differentiation. No mass effect or midline shift is seen. Vascular: No hyperdense vessel or unexpected calcification. Skull: There is no evidence of fracture; visualized osseous structures are unremarkable in appearance. Sinuses/Orbits: The visualized portions of the orbits are within normal limits. The paranasal sinuses and mastoid air cells are well-aerated. Other: Soft tissue swelling is  noted overlying the right frontal calvarium. CT CERVICAL SPINE FINDINGS Alignment: Normal. Skull base and vertebrae: No acute fracture. No primary bone lesion or focal pathologic  process. Soft tissues and spinal canal: No prevertebral fluid or swelling. No visible canal hematoma. Disc levels: Intervertebral disc space narrowing is noted at C3-C4 and at the lower cervical spine, with scattered anterior and posterior disc osteophyte complexes. Upper chest: The visualized lung apices are clear. The thyroid gland is unremarkable. Other: Dense calcification is seen at the carotid bifurcations, particularly on the right, with likely mild luminal narrowing. IMPRESSION: 1. No evidence of traumatic intracranial injury or fracture. 2. Soft tissue swelling overlying the right frontal calvarium. 3. Mild small vessel ischemic microangiopathy. 4. Mild degenerative change along the cervical spine. 5. Dense calcification at the carotid bifurcations, particularly on the right, with likely mild luminal narrowing. Carotid ultrasound would be helpful for further evaluation, when and as deemed clinically appropriate. Electronically Signed: By: Garald Balding M.D. On: 08/11/2018 18:02   Ct Cervical Spine Wo Contrast  Addendum Date: 08/11/2018   ADDENDUM REPORT: 08/11/2018 18:13 ADDENDUM: Note is made of a small fracture through the right side of the posterior spinous process of C5. This is a stable fracture. These results were called by telephone at the time of interpretation on 08/11/2018 at 6:11 pm to Dr. Alroy Bailiff, who verbally acknowledged these results. Electronically Signed   By: Garald Balding M.D.   On: 08/11/2018 18:13   Result Date: 08/11/2018 CLINICAL DATA:  Status post fall off stool, with possible syncopal episode. Small laceration above the right orbit. Concern for head or cervical spine injury. Initial encounter. EXAM: CT HEAD WITHOUT CONTRAST CT CERVICAL SPINE WITHOUT CONTRAST TECHNIQUE: Multidetector CT imaging of the head and cervical spine was performed following the standard protocol without intravenous contrast. Multiplanar CT image reconstructions of the cervical spine were also  generated. COMPARISON:  CT of the head performed 05/19/2015 FINDINGS: CT HEAD FINDINGS Brain: No evidence of acute infarction, hemorrhage, hydrocephalus, extra-axial collection or mass lesion / mass effect. Mild periventricular and subcortical white matter change likely reflects small vessel ischemic microangiopathy. A likely small chronic lacunar infarct is noted superior to the right caudate, new from 2017. The brainstem and fourth ventricle are within normal limits. The basal ganglia are unremarkable in appearance. The cerebral hemispheres demonstrate grossly normal gray-white differentiation. No mass effect or midline shift is seen. Vascular: No hyperdense vessel or unexpected calcification. Skull: There is no evidence of fracture; visualized osseous structures are unremarkable in appearance. Sinuses/Orbits: The visualized portions of the orbits are within normal limits. The paranasal sinuses and mastoid air cells are well-aerated. Other: Soft tissue swelling is noted overlying the right frontal calvarium. CT CERVICAL SPINE FINDINGS Alignment: Normal. Skull base and vertebrae: No acute fracture. No primary bone lesion or focal pathologic process. Soft tissues and spinal canal: No prevertebral fluid or swelling. No visible canal hematoma. Disc levels: Intervertebral disc space narrowing is noted at C3-C4 and at the lower cervical spine, with scattered anterior and posterior disc osteophyte complexes. Upper chest: The visualized lung apices are clear. The thyroid gland is unremarkable. Other: Dense calcification is seen at the carotid bifurcations, particularly on the right, with likely mild luminal narrowing. IMPRESSION: 1. No evidence of traumatic intracranial injury or fracture. 2. Soft tissue swelling overlying the right frontal calvarium. 3. Mild small vessel ischemic microangiopathy. 4. Mild degenerative change along the cervical spine. 5. Dense calcification at the carotid bifurcations, particularly on the  right, with  likely mild luminal narrowing. Carotid ultrasound would be helpful for further evaluation, when and as deemed clinically appropriate. Electronically Signed: By: Garald Balding M.D. On: 08/11/2018 18:02   Dg Chest Portable 1 View  Result Date: 08/11/2018 CLINICAL DATA:  Syncope and collapse, fell off a bar stool onto floor, BILATERAL shoulder pain, dementia, coronary artery disease, hypertension EXAM: PORTABLE CHEST 1 VIEW COMPARISON:  Portable exam 1653 hours compared to 05/19/2015 FINDINGS: Normal heart size post median sternotomy. Mediastinal contours and pulmonary vascularity normal. Lungs clear. No infiltrate, pleural effusion or pneumothorax. Bones unremarkable. IMPRESSION: No acute abnormalities. Electronically Signed   By: Lavonia Dana M.D.   On: 08/11/2018 18:09   Dg Shoulder Left Port  Result Date: 08/11/2018 CLINICAL DATA:  Fall EXAM: LEFT SHOULDER - 1 VIEW COMPARISON:  Portable exam 1653 hours without priors for comparison FINDINGS: Mild degenerative changes at LEFT [facility] joint. Osseous demineralization. No fracture, dislocation or bone destruction seen on single AP view. IMPRESSION: No acute osseous abnormalities. Mild degenerative changes of the LEFT AC joint. Electronically Signed   By: Lavonia Dana M.D.   On: 08/11/2018 18:20   Dg Shoulder Right Port  Result Date: 08/11/2018 CLINICAL DATA:  Fall EXAM: PORTABLE RIGHT SHOULDER COMPARISON:  Portable exam 3154 hours without priors for comparison FINDINGS: Osseous demineralization. AC joint alignment normal. No fracture, dislocation or bone destruction identified on single AP view. IMPRESSION: No acute abnormalities. Electronically Signed   By: Lavonia Dana M.D.   On: 08/11/2018 18:20    Procedures .Marland KitchenLaceration Repair  Date/Time: 08/11/2018 9:52 PM Performed by: Jefm Petty, MD Authorized by: Courtney Paris, MD   Consent:    Consent obtained:  Verbal   Consent given by:  Patient   Risks discussed:  Infection,  retained foreign body, pain, poor cosmetic result and poor wound healing   Alternatives discussed:  No treatment, delayed treatment and observation Anesthesia (see MAR for exact dosages):    Anesthesia method:  Local infiltration   Local anesthetic:  Lidocaine 1% w/o epi Laceration details:    Location:  Face   Face location:  R cheek   Length (cm):  3   Depth (mm):  3 Repair type:    Repair type:  Simple Pre-procedure details:    Preparation:  Patient was prepped and draped in usual sterile fashion Exploration:    Hemostasis achieved with:  Direct pressure   Contaminated: no   Treatment:    Area cleansed with:  Betadine and saline   Amount of cleaning:  Standard   Irrigation solution:  Sterile saline   Irrigation volume:  30   Irrigation method:  Syringe Skin repair:    Repair method:  Sutures   Suture size:  4-0   Suture material:  Chromic gut   Suture technique:  Simple interrupted   Number of sutures:  5 Approximation:    Approximation:  Close Post-procedure details:    Dressing:  Open (no dressing)   Patient tolerance of procedure:  Tolerated well, no immediate complications   (including critical care time)  Medications Ordered in ED Medications  Tdap (BOOSTRIX) injection 0.5 mL (0.5 mLs Intramuscular Given 08/11/18 1841)  lidocaine (PF) (XYLOCAINE) 1 % injection 5 mL (5 mLs Intradermal Given 08/11/18 2013)  fentaNYL (SUBLIMAZE) injection 75 mcg (75 mcg Intravenous Given 08/11/18 1742)     Initial Impression / Assessment and Plan / ED Course  I have reviewed the triage vital signs and the nursing notes.  Pertinent labs & imaging  results that were available during my care of the patient were reviewed by me and considered in my medical decision making (see chart for details).     Of note, this patient was evaluated in the Emergency Department for the symptoms described in the history of present illness. He was evaluated in the context of the global COVID-19  pandemic, which necessitated consideration that the patient might be at risk for infection with the SARS-CoV-2 virus that causes COVID-19. Institutional protocols and algorithms that pertain to the evaluation of patients at risk for COVID-19 are in a state of rapid change based on information released by regulatory bodies including the CDC and federal and state organizations. These policies and algorithms were followed during the patient's care in the ED.  During this patient encounter, the patient was wearing a mask, and throughout this encounter I was wearing at least a surgical mask.  I was not within 6 feet of this patient for more than 15 minutes without eye protection when they were not wearing mask.   Differentials considered: Syncope, symptomatic bradycardia,   EM Physician interpretation of Labs & Imaging:  BMP grossly unremarkable, serum creatinine is elevated at 1.32, however this appears to be a patient's baseline.  CBC is unremarkable  Serum magnesium normal  Initial serum troponin of 5  Portable chest no obvious acute abnormalities  Normal shoulder x-ray  CT head with no obvious acute intracranial abnormality  CT cervical spine with small fracture through the right side of the posterior spinous process of C5.   Medical Decision Making:  [name] is a 73 y.o. male with the above past medical history significant for HTN, HLD, CAD who presented to the emergency department today for evaluation after a syncopal episode at home while sitting on a barstool, he fell and struck his face.  He has a history of chronic sinus bradycardia and was bradycardic here in the emergency department.  He has no shortness of breath, chest pain or palpitations. He is at his baseline with advanced dementia at this time.   CT Head without obvious acute intracranial abnormality, CT cervical spine results as listed above.  Neurosurgery has been consulted and has seen the patient at the bedside in  the emergency department.  He does not require any surgical intervention at this time, he also does not require a hard collar, he can wear a soft collar for comfort.  They have discussed this at the bedside with the patient's wife as well.  He does not require acute follow-up.  The laceration to his right cheek has been repaired, patient is admitted to the [facility] hospitalist service for further inpatient monitoring and work-up of this syncopal episode.   The plan for this patient was discussed with my attending physician, Dr. Marda Stalker, who voiced agreement and who oversaw evaluation and treatment of this patient.   CLINICAL IMPRESSION: 1. Syncope and collapse   2. Fall   3. Closed fracture of spinous process of cervical vertebra, initial encounter ([place])   4. Laceration of skin of face, initial encounter      Disposition: Admit   Camika Marsico A. Jimmye Norman, MD Resident Physician, PGY-3 Emergency Medicine [facility]    Jefm Petty, MD 08/11/18 2207    Tegeler, Gwenyth Allegra, MD 08/14/18 [phone]

## 2018-08-11 NOTE — ED Triage Notes (Signed)
Pt fell off of stool at home falling face first.  Possible syncopal episode.  Pt has hx of dementia.  Pt c/o pain bilateral shoulders.  Small cut noted above right eye

## 2018-08-11 NOTE — ED Notes (Signed)
ED TO INPATIENT HANDOFF REPORT  ED Nurse Name and Phone #: [phone] Victorina Kable  S Name/Age/Gender [name] 73 y.o. male Room/Bed: 018C/018C  Code Status   Code Status: Prior  Home/SNF/Other Home Patient oriented to: self Is this baseline? Yes   Triage Complete: Triage complete  Chief Complaint Fall  Triage Note Pt fell off of stool at home falling face first.  Possible syncopal episode.  Pt has hx of dementia.  Pt c/o pain bilateral shoulders.  Small cut noted above right eye   Allergies Allergies  Allergen Reactions  . Oxycodone-Acetaminophen Other (See Comments)    Pt saw bugs    Level of Care/Admitting Diagnosis ED Disposition    ED Disposition Condition [facility] Area: [place] [100100]  Level of Care: Telemetry Medical [104]  I expect the patient will be discharged within 24 hours: No (not a candidate for 5C-Observation unit)  Covid Evaluation: Asymptomatic Screening Protocol (No Symptoms)  Diagnosis: Syncope [206001]  Admitting Physician: Bonnell Public [[id number]]  Attending Physician: Dana Allan I [[id number]]  PT Class (Do Not Modify): Observation [104]  PT Acc Code (Do Not Modify): Observation [10022]       B Medical/Surgery History Past Medical History:  Diagnosis Date  . Arthritis   . Chest pain   . Colon polyp   . Coronary artery disease    post PTCA and stent of the distal RCA. He has a 3.0 mm espress 2 stent deployed at 14 atmospheres placed in [place], [place]            . Dyslipidemia   . Erectile dysfunction   . GERD (gastroesophageal reflux disease)   . Headache   . Hyperlipidemia   . Hypertension   . Kidney stones    history   . Memory loss   . Skin cancer 2015   Past Surgical History:  Procedure Laterality Date  . BACK SURGERY    . CAROTID ENDARTERECTOMY    . COLONOSCOPY    . CORONARY ANGIOPLASTY WITH STENT PLACEMENT     post PTCA and stent of the distal RCA. He has a 3.0 mm espress 2  stent deployed at 14 atmospheres placed in [place], [place]  06/2014  . EYE SURGERY    . HERNIA REPAIR    . JOINT REPLACEMENT     left knee  . POLYPECTOMY    . TOTAL KNEE ARTHROPLASTY     left     A IV Location/Drains/Wounds Patient Lines/Drains/Airways Status   Active Line/Drains/Airways    Name:   Placement date:   Placement time:   Site:   Days:   Peripheral IV 08/11/18 Right Hand   08/11/18    1615    Hand   less than 1          Intake/Output Last 24 hours No intake or output data in the 24 hours ending 08/11/18 2100  Labs/Imaging Results for orders placed or performed during the hospital encounter of 08/11/18 (from the past 48 hour(s))  Basic metabolic panel     Status: Abnormal   Collection Time: 08/11/18  4:38 PM  Result Value Ref Range   Sodium 136 135 - 145 mmol/L   Potassium 4.1 3.5 - 5.1 mmol/L   Chloride 102 98 - 111 mmol/L   CO2 25 22 - 32 mmol/L   Glucose, Bld 120 (H) 70 -  99 mg/dL   BUN 21 8 - 23 mg/dL   Creatinine, Ser 1.32 (H) 0.61 - 1.24 mg/dL   Calcium 8.8 (L) 8.9 - 10.3 mg/dL   GFR calc non Af Amer 54 (L) >60 mL/min   GFR calc Af Amer >60 >60 mL/min   Anion gap 9 5 - 15    Comment: Performed at [facility] [address] [place], [place] [address]  CBC WITH DIFFERENTIAL     Status: None   Collection Time: 08/11/18  4:38 PM  Result Value Ref Range   WBC 8.9 4.0 - 10.5 K/uL   RBC 4.37 4.22 - 5.81 MIL/uL   Hemoglobin 14.2 13.0 - 17.0 g/dL   HCT 41.3 39.0 - 52.0 %   MCV 94.5 80.0 - 100.0 fL   MCH 32.5 26.0 - 34.0 pg   MCHC 34.4 30.0 - 36.0 g/dL   RDW 11.9 11.5 - 15.5 %   Platelets 192 150 - 400 K/uL   nRBC 0.0 0.0 - 0.2 %   Neutrophils Relative % 76 %   Neutro Abs 6.7 1.7 - 7.7 K/uL   Lymphocytes Relative 19 %   Lymphs Abs 1.7 0.7 - 4.0 K/uL   Monocytes Relative 5 %   Monocytes Absolute 0.5 0.1 - 1.0 K/uL   Eosinophils Relative 0 %   Eosinophils Absolute 0.0 0.0 - 0.5 K/uL   Basophils  Relative 0 %   Basophils Absolute 0.0 0.0 - 0.1 K/uL   Immature Granulocytes 0 %   Abs Immature Granulocytes 0.02 0.00 - 0.07 K/uL    Comment: Performed at [facility], [address], [place], [place] [address]  Troponin I (High Sensitivity)     Status: None   Collection Time: 08/11/18  4:38 PM  Result Value Ref Range   Troponin I (High Sensitivity) 5 <18 ng/L    Comment: (NOTE) Elevated high sensitivity troponin I (hsTnI) values and significant  changes across serial measurements may suggest ACS but many other  chronic and acute conditions are known to elevate hsTnI results.  Refer to the "Links" section for chest pain algorithms and additional  guidance. Performed at [facility], [place] [address] [place], [place] [address]   Magnesium     Status: None   Collection Time: 08/11/18  5:12 PM  Result Value Ref Range   Magnesium 2.2 1.7 - 2.4 mg/dL    Comment: Performed at [facility] [address], [place], [place] [address]  SARS Coronavirus 2 (CEPHEID - Performed in [facility]), Hosp Order     Status: None   Collection Time: 08/11/18  5:53 PM   Specimen: Nasopharyngeal Swab  Result Value Ref Range   SARS Coronavirus 2 NEGATIVE NEGATIVE    Comment: (NOTE) If result is NEGATIVE SARS-CoV-2 target nucleic acids are NOT DETECTED. The SARS-CoV-2 RNA is generally detectable in upper and lower  respiratory specimens during the acute phase of infection. The lowest  concentration of SARS-CoV-2 viral copies this assay can detect is 250  copies / mL. A negative result does not preclude SARS-CoV-2 infection  and should not be used as the sole basis for treatment or other  patient management decisions.  A negative result may occur with  improper specimen collection / handling, submission of specimen other  than nasopharyngeal swab, presence of viral mutation(s) within the  areas targeted by this assay, and inadequate number of viral copies  (<250 copies /  mL). A  negative result must be combined with clinical  observations, patient history, and epidemiological information. If result is POSITIVE SARS-CoV-2 target nucleic acids are DETECTED. The SARS-CoV-2 RNA is generally detectable in upper and lower  respiratory specimens dur ing the acute phase of infection.  Positive  results are indicative of active infection with SARS-CoV-2.  Clinical  correlation with patient history and other diagnostic information is  necessary to determine patient infection status.  Positive results do  not rule out bacterial infection or co-infection with other viruses. If result is PRESUMPTIVE POSTIVE SARS-CoV-2 nucleic acids MAY BE PRESENT.   A presumptive positive result was obtained on the submitted specimen  and confirmed on repeat testing.  While 2019 novel coronavirus  (SARS-CoV-2) nucleic acids may be present in the submitted sample  additional confirmatory testing may be necessary for epidemiological  and / or clinical management purposes  to differentiate between  SARS-CoV-2 and other Sarbecovirus currently known to infect humans.  If clinically indicated additional testing with an alternate test  methodology [phone]) is advised. The SARS-CoV-2 RNA is generally  detectable in upper and lower respiratory sp ecimens during the acute  phase of infection. The expected result is Negative. Fact Sheet for Patients:  StrictlyIdeas.no Fact Sheet for Healthcare Providers: BankingDealers.co.za This test is not yet approved or cleared by the [place] FDA and has been authorized for detection and/or diagnosis of SARS-CoV-2 by FDA under an Emergency Use Authorization (EUA).  This EUA will remain in effect (meaning this test can be used) for the duration of the COVID-19 declaration under Section 564(b)(1) of the Act, 21 U.S.C. section 360bbb-3(b)(1), unless the authorization is terminated or revoked  sooner. Performed at [facility], [place] [address], [place], [place] [address]   Troponin I (High Sensitivity)     Status: None   Collection Time: 08/11/18  6:30 PM  Result Value Ref Range   Troponin I (High Sensitivity) 5 <18 ng/L    Comment: (NOTE) Elevated high sensitivity troponin I (hsTnI) values and significant  changes across serial measurements may suggest ACS but many other  chronic and acute conditions are known to elevate hsTnI results.  Refer to the "Links" section for chest pain algorithms and additional  guidance. Performed at [facility], [place] [address] [place], [place] [address]    Ct Head Wo Contrast  Addendum Date: 08/11/2018   ADDENDUM REPORT: 08/11/2018 18:13 ADDENDUM: Note is made of a small fracture through the right side of the posterior spinous process of C5. This is a stable fracture. These results were called by telephone at the time of interpretation on 08/11/2018 at 6:11 pm to Dr. Alroy Bailiff, who verbally acknowledged these results. Electronically Signed   By: Garald Balding M.D.   On: 08/11/2018 18:13   Result Date: 08/11/2018 CLINICAL DATA:  Status post fall off stool, with possible syncopal episode. Small laceration above the right orbit. Concern for head or cervical spine injury. Initial encounter. EXAM: CT HEAD WITHOUT CONTRAST CT CERVICAL SPINE WITHOUT CONTRAST TECHNIQUE: Multidetector CT imaging of the head and cervical spine was performed following the standard protocol without intravenous contrast. Multiplanar CT image reconstructions of the cervical spine were also generated. COMPARISON:  CT of the head performed 05/19/2015 FINDINGS: CT HEAD FINDINGS Brain: No evidence of acute infarction, hemorrhage, hydrocephalus, extra-axial collection or mass lesion / mass effect. Mild periventricular and subcortical white matter change likely reflects small vessel ischemic microangiopathy. A likely small chronic lacunar infarct is noted superior to  the  right caudate, new from 2017. The brainstem and fourth ventricle are within normal limits. The basal ganglia are unremarkable in appearance. The cerebral hemispheres demonstrate grossly normal gray-white differentiation. No mass effect or midline shift is seen. Vascular: No hyperdense vessel or unexpected calcification. Skull: There is no evidence of fracture; visualized osseous structures are unremarkable in appearance. Sinuses/Orbits: The visualized portions of the orbits are within normal limits. The paranasal sinuses and mastoid air cells are well-aerated. Other: Soft tissue swelling is noted overlying the right frontal calvarium. CT CERVICAL SPINE FINDINGS Alignment: Normal. Skull base and vertebrae: No acute fracture. No primary bone lesion or focal pathologic process. Soft tissues and spinal canal: No prevertebral fluid or swelling. No visible canal hematoma. Disc levels: Intervertebral disc space narrowing is noted at C3-C4 and at the lower cervical spine, with scattered anterior and posterior disc osteophyte complexes. Upper chest: The visualized lung apices are clear. The thyroid gland is unremarkable. Other: Dense calcification is seen at the carotid bifurcations, particularly on the right, with likely mild luminal narrowing. IMPRESSION: 1. No evidence of traumatic intracranial injury or fracture. 2. Soft tissue swelling overlying the right frontal calvarium. 3. Mild small vessel ischemic microangiopathy. 4. Mild degenerative change along the cervical spine. 5. Dense calcification at the carotid bifurcations, particularly on the right, with likely mild luminal narrowing. Carotid ultrasound would be helpful for further evaluation, when and as deemed clinically appropriate. Electronically Signed: By: Garald Balding M.D. On: 08/11/2018 18:02   Ct Cervical Spine Wo Contrast  Addendum Date: 08/11/2018   ADDENDUM REPORT: 08/11/2018 18:13 ADDENDUM: Note is made of a small fracture through the right side of  the posterior spinous process of C5. This is a stable fracture. These results were called by telephone at the time of interpretation on 08/11/2018 at 6:11 pm to Dr. Alroy Bailiff, who verbally acknowledged these results. Electronically Signed   By: Garald Balding M.D.   On: 08/11/2018 18:13   Result Date: 08/11/2018 CLINICAL DATA:  Status post fall off stool, with possible syncopal episode. Small laceration above the right orbit. Concern for head or cervical spine injury. Initial encounter. EXAM: CT HEAD WITHOUT CONTRAST CT CERVICAL SPINE WITHOUT CONTRAST TECHNIQUE: Multidetector CT imaging of the head and cervical spine was performed following the standard protocol without intravenous contrast. Multiplanar CT image reconstructions of the cervical spine were also generated. COMPARISON:  CT of the head performed 05/19/2015 FINDINGS: CT HEAD FINDINGS Brain: No evidence of acute infarction, hemorrhage, hydrocephalus, extra-axial collection or mass lesion / mass effect. Mild periventricular and subcortical white matter change likely reflects small vessel ischemic microangiopathy. A likely small chronic lacunar infarct is noted superior to the right caudate, new from 2017. The brainstem and fourth ventricle are within normal limits. The basal ganglia are unremarkable in appearance. The cerebral hemispheres demonstrate grossly normal gray-white differentiation. No mass effect or midline shift is seen. Vascular: No hyperdense vessel or unexpected calcification. Skull: There is no evidence of fracture; visualized osseous structures are unremarkable in appearance. Sinuses/Orbits: The visualized portions of the orbits are within normal limits. The paranasal sinuses and mastoid air cells are well-aerated. Other: Soft tissue swelling is noted overlying the right frontal calvarium. CT CERVICAL SPINE FINDINGS Alignment: Normal. Skull base and vertebrae: No acute fracture. No primary bone lesion or focal pathologic process. Soft  tissues and spinal canal: No prevertebral fluid or swelling. No visible canal hematoma. Disc levels: Intervertebral disc space narrowing is noted at C3-C4 and at the lower cervical spine,  with scattered anterior and posterior disc osteophyte complexes. Upper chest: The visualized lung apices are clear. The thyroid gland is unremarkable. Other: Dense calcification is seen at the carotid bifurcations, particularly on the right, with likely mild luminal narrowing. IMPRESSION: 1. No evidence of traumatic intracranial injury or fracture. 2. Soft tissue swelling overlying the right frontal calvarium. 3. Mild small vessel ischemic microangiopathy. 4. Mild degenerative change along the cervical spine. 5. Dense calcification at the carotid bifurcations, particularly on the right, with likely mild luminal narrowing. Carotid ultrasound would be helpful for further evaluation, when and as deemed clinically appropriate. Electronically Signed: By: Garald Balding M.D. On: 08/11/2018 18:02   Dg Chest Portable 1 View  Result Date: 08/11/2018 CLINICAL DATA:  Syncope and collapse, fell off a bar stool onto floor, BILATERAL shoulder pain, dementia, coronary artery disease, hypertension EXAM: PORTABLE CHEST 1 VIEW COMPARISON:  Portable exam 1653 hours compared to 05/19/2015 FINDINGS: Normal heart size post median sternotomy. Mediastinal contours and pulmonary vascularity normal. Lungs clear. No infiltrate, pleural effusion or pneumothorax. Bones unremarkable. IMPRESSION: No acute abnormalities. Electronically Signed   By: Lavonia Dana M.D.   On: 08/11/2018 18:09   Dg Shoulder Left Port  Result Date: 08/11/2018 CLINICAL DATA:  Fall EXAM: LEFT SHOULDER - 1 VIEW COMPARISON:  Portable exam 1653 hours without priors for comparison FINDINGS: Mild degenerative changes at LEFT [facility] joint. Osseous demineralization. No fracture, dislocation or bone destruction seen on single AP view. IMPRESSION: No acute osseous abnormalities. Mild  degenerative changes of the LEFT AC joint. Electronically Signed   By: Lavonia Dana M.D.   On: 08/11/2018 18:20   Dg Shoulder Right Port  Result Date: 08/11/2018 CLINICAL DATA:  Fall EXAM: PORTABLE RIGHT SHOULDER COMPARISON:  Portable exam 1497 hours without priors for comparison FINDINGS: Osseous demineralization. AC joint alignment normal. No fracture, dislocation or bone destruction identified on single AP view. IMPRESSION: No acute abnormalities. Electronically Signed   By: Lavonia Dana M.D.   On: 08/11/2018 18:20    Pending Labs Unresulted Labs (From admission, onward)   None      Vitals/Pain Today's Vitals   08/11/18 2000 08/11/18 2015 08/11/18 2030 08/11/18 2045  BP: 128/75 104/80 120/71 121/61  Pulse: (!) 48 (!) 49 (!) 49 (!) 49  Resp: 10 10 13 13   Temp:      TempSrc:      SpO2: 99% 99% 98% 99%  PainSc:        Isolation Precautions No active isolations  Medications Medications  Tdap (BOOSTRIX) injection 0.5 mL (0.5 mLs Intramuscular Given 08/11/18 1841)  lidocaine (PF) (XYLOCAINE) 1 % injection 5 mL (5 mLs Intradermal Given 08/11/18 2013)  fentaNYL (SUBLIMAZE) injection 75 mcg (75 mcg Intravenous Given 08/11/18 1742)    Mobility Non ambulatory.  Pt has weakness High fall risk   Focused Assessments Musculoskeletal   R Recommendations: See Admitting Provider Note  Report given to:   Additional Notes: Pt is only oriented to self.  Pt has dementia.  Wife has been at bedside and able to answer questions.  Pt unable to recall history.  Not able to answer questions

## 2018-08-11 NOTE — H&P (Signed)
History and Physical  [name]:[id number] DOB: 09-Mar-1945 DOA: 08/11/2018  Referring physician: ER provider PCP: Chesley Noon, MD  Outpatient Specialists:    Patient coming from: Home  Chief Complaint: Syncope  HPI:  73 year old male with past medical history significant for dementia, nephrolithiasis, hypertension, hyperlipidemia, headaches, GERD, arthritis, carotid endarterectomy and coronary artery disease is post CABG.  Due to dementing illness, the patient is not able to give any significant history.  History is come from patient's wife.  According to the patient's wife, the patient was sitting on a stool having his dinner and he experienced syncope.  Patient was said to have leaned towards one side and fell face down, sustaining facial lacerations.  No prior syncopes.  No prior falls.  No constitutional symptoms prior to the syncope.  No headache, no neck pain, no chest pain, no shortness of breath, no GI symptoms.  Cervical MRI is as noted below.  Patient will be admitted for further assessment and management.  Patient is bradycardic.  ED Course: On presentation to the hospital, temperature was 97.5, blood pressure of 128/80, heart rate of 50 bpm, respiratory rate of 15 and O2 sat of 100%.  Facial laceration is been sutured by the ER provider.  Work-up done through face as documented below.  Hospitalist service has been asked to admit patient for further assessment and management.  Pertinent labs: Chemistry reveals sodium of 136, potassium of 4.1, chloride 102, CO2 25, BUN of 21, creatinine of 1.32 (at patient's baseline) and blood sugar of 120.  CBC reveals WBC of 8.9, hemoglobin of 14.2, hematocrit of 41.3, MCV of 94.5, platelet count of 192.  High sensitive troponins have remained negative at 5.  EKG: Independently reviewed.   Imaging: independently reviewed.  T scan of the cervical spine without contrast revealed small fracture through the right side of the posterior  spinous process of C5, stable.  Without contrast is negative for any traumatic intracranial injury or fracture.  X-ray is nonrevealing.  X-ray of the left shoulder revealed mild degenerative changes at the left [facility] joint.  X-ray of the right shoulder has not shown any acute abnormality.  Review of Systems:  Negative for fever, visual changes, sore throat, rash, new muscle aches, chest pain, SOB, dysuria, bleeding, n/v/abdominal pain.  Past Medical History:  Diagnosis Date   Arthritis    Chest pain    Colon polyp    Coronary artery disease    post PTCA and stent of the distal RCA. He has a 3.0 mm espress 2 stent deployed at 14 atmospheres placed in [place], [place]             Dyslipidemia    Erectile dysfunction    GERD (gastroesophageal reflux disease)    Headache    Hyperlipidemia    Hypertension    Kidney stones    history    Memory loss    Skin cancer 2015    Past Surgical History:  Procedure Laterality Date   BACK SURGERY     CAROTID ENDARTERECTOMY     COLONOSCOPY     CORONARY ANGIOPLASTY WITH STENT PLACEMENT     post PTCA and stent of the distal RCA. He has a 3.0 mm espress 2 stent deployed at 14 atmospheres placed in [place], [place]FT  06/2014   EYE SURGERY     HERNIA REPAIR     JOINT REPLACEMENT  left knee   POLYPECTOMY     TOTAL KNEE ARTHROPLASTY     left     reports that he has never smoked. He has quit using smokeless tobacco.  His smokeless tobacco use included chew. He reports current alcohol use. He reports that he does not use drugs.  Allergies  Allergen Reactions   Oxycodone-Acetaminophen Other (See Comments)    Pt saw bugs    Family History  Problem Relation Age of Onset   Aortic aneurysm Father    Cancer Father        colon    Coronary artery disease Mother        with CABG   Alzheimer's disease Mother    Hypertension Brother      Prior to Admission medications   Medication  Sig Start Date End Date Taking? Authorizing Provider  amLODipine (NORVASC) 2.5 MG tablet Take 2.5 mg by mouth at bedtime. 05/15/15   [provider]  aspirin EC 81 MG tablet Take 81 mg by mouth daily.    [provider]  benazepril (LOTENSIN) 10 MG tablet Take 10 mg by mouth at bedtime. 05/15/15   [provider]  celecoxib (CELEBREX) 200 MG capsule Take 200 mg by mouth daily.      [provider]  citalopram (CELEXA) 20 MG tablet Take 20 mg by mouth daily.    [provider]  lansoprazole (PREVACID) 15 MG capsule Take 15 mg by mouth daily at 12 noon.    [provider]  memantine (NAMENDA) 10 MG tablet Take 2 tablets by mouth daily. 06/23/17   [provider]  Misc Natural Products (OSTEO BI-FLEX JOINT SHIELD) TABS Take 1 tablet by mouth daily.      [provider]  Multiple Vitamin (MULTIVITAMIN WITH MINERALS) TABS tablet Take 1 tablet by mouth daily.    [provider]  Omega-3 Fatty Acids (FISH OIL) 1200 MG CAPS Take 1,200 mg by mouth daily.     [provider]  rivastigmine (EXELON) 4.6 mg/24hr Place 4.6 mg onto the skin daily. 03/19/15   [provider]  rosuvastatin (CRESTOR) 10 MG tablet Take 1 tablet (10 mg total) by mouth daily. 01/24/18 01/19/19  Nahser, Wonda Cheng, MD  Saw Palmetto, Serenoa repens, (SAW PALMETTO PO) Take 1 capsule by mouth at bedtime.     [provider]  vitamin C (ASCORBIC ACID) 500 MG tablet Take 500 mg by mouth at bedtime.     [provider]    Physical Exam: Vitals:   08/11/18 1747 08/11/18 1800 08/11/18 1815 08/11/18 1830  BP: 121/77 125/77 121/69 121/69  Pulse:    (!) 49  Resp: 10 12 12 11   Temp:      TempSrc:      SpO2:    96%     Constitutional:   Appears calm and comfortable Eyes:   No pallor. No jaundice.  ENMT:   external ears, nose appear normal Neck:   Neck is supple. No JVD Respiratory:   CTA bilaterally, no w/r/r.    Respiratory effort normal. No retractions or accessory muscle use Cardiovascular:   S1S2  No LE extremity edema   Abdomen:   Abdomen is soft and non tender. Organs are difficult to assess. Neurologic:   Awake and alert.  Moves all limbs.  Wt Readings from Last 3 Encounters:  01/24/18 104.3 kg  07/12/17 107.5 kg  05/20/15 98.5 kg    I have personally reviewed following labs and imaging  studies  Labs on Admission:  CBC: Recent Labs  Lab 08/11/18 1638  WBC 8.9  NEUTROABS 6.7  HGB 14.2  HCT 41.3  MCV 94.5  PLT 597   Basic Metabolic Panel: Recent Labs  Lab 08/11/18 1638 08/11/18 1712  NA 136  --   K 4.1  --   CL 102  --   CO2 25  --   GLUCOSE 120*  --   BUN 21  --   CREATININE 1.32*  --   CALCIUM 8.8*  --   MG  --  2.2   Liver Function Tests: No results for input(s): AST, ALT, ALKPHOS, BILITOT, PROT, ALBUMIN in the last 168 hours. No results for input(s): LIPASE, AMYLASE in the last 168 hours. No results for input(s): AMMONIA in the last 168 hours. Coagulation Profile: No results for input(s): INR, PROTIME in the last 168 hours. Cardiac Enzymes: No results for input(s): CKTOTAL, CKMB, CKMBINDEX, TROPONINI in the last 168 hours. BNP (last 3 results) No results for input(s): PROBNP in the last 8760 hours. HbA1C: No results for input(s): HGBA1C in the last 72 hours. CBG: No results for input(s): GLUCAP in the last 168 hours. Lipid Profile: No results for input(s): CHOL, HDL, LDLCALC, TRIG, CHOLHDL, LDLDIRECT in the last 72 hours. Thyroid Function Tests: No results for input(s): TSH, T4TOTAL, FREET4, T3FREE, THYROIDAB in the last 72 hours. Anemia Panel: No results for input(s): VITAMINB12, FOLATE, FERRITIN, TIBC, IRON, RETICCTPCT in the last 72 hours. Urine analysis:    Component Value Date/Time   COLORURINE YELLOW 05/19/2015 2030   APPEARANCEUR CLEAR 05/19/2015 2030   LABSPEC 1.021 05/19/2015 2030   PHURINE 6.5 05/19/2015 2030   GLUCOSEU  NEGATIVE 05/19/2015 2030   [place] NEGATIVE 05/19/2015 2030   [place] NEGATIVE 05/19/2015 2030   [place] 05/19/2015 2030   PROTEINUR 30 (A) 05/19/2015 2030   UROBILINOGEN 1.0 06/19/2008 0920   NITRITE NEGATIVE 05/19/2015 2030   LEUKOCYTESUR NEGATIVE 05/19/2015 2030   Sepsis Labs: @LABRCNTIP (procalcitonin:4,lacticidven:4) ) Recent Results (from the past 240 hour(s))  SARS Coronavirus 2 (CEPHEID - Performed in [place] hospital lab), Hosp Order     Status: None   Collection Time: 08/11/18  5:53 PM   Specimen: Nasopharyngeal Swab  Result Value Ref Range Status   SARS Coronavirus 2 NEGATIVE NEGATIVE Final    Comment: (NOTE) If result is NEGATIVE SARS-CoV-2 target nucleic acids are NOT DETECTED. The SARS-CoV-2 RNA is generally detectable in upper and lower  respiratory specimens during the acute phase of infection. The lowest  concentration of SARS-CoV-2 viral copies this assay can detect is 250  copies / mL. A negative result does not preclude SARS-CoV-2 infection  and should not be used as the sole basis for treatment or other  patient management decisions.  A negative result may occur with  improper specimen collection / handling, submission of specimen other  than nasopharyngeal swab, presence of viral mutation(s) within the  areas targeted by this assay, and inadequate number of viral copies  (<250 copies / mL). A negative result must be combined with clinical  observations, patient history, and epidemiological information. If result is POSITIVE SARS-CoV-2 target nucleic acids are DETECTED. The SARS-CoV-2 RNA is generally detectable in upper and lower  respiratory specimens dur ing the acute phase of infection.  Positive  results are indicative of active infection with SARS-CoV-2.  Clinical  correlation with patient history and other diagnostic information is  necessary to determine patient infection status.  Positive results do  not  rule out bacterial  infection or co-infection with other viruses. If result is PRESUMPTIVE POSTIVE SARS-CoV-2 nucleic acids MAY BE PRESENT.   A presumptive positive result was obtained on the submitted specimen  and confirmed on repeat testing.  While 2019 novel coronavirus  (SARS-CoV-2) nucleic acids may be present in the submitted sample  additional confirmatory testing may be necessary for epidemiological  and / or clinical management purposes  to differentiate between  SARS-CoV-2 and other Sarbecovirus currently known to infect humans.  If clinically indicated additional testing with an alternate test  methodology [phone]) is advised. The SARS-CoV-2 RNA is generally  detectable in upper and lower respiratory sp ecimens during the acute  phase of infection. The expected result is Negative. Fact Sheet for Patients:  StrictlyIdeas.no Fact Sheet for Healthcare Providers: BankingDealers.co.za This test is not yet approved or cleared by the [place] FDA and has been authorized for detection and/or diagnosis of SARS-CoV-2 by FDA under an Emergency Use Authorization (EUA).  This EUA will remain in effect (meaning this test can be used) for the duration of the COVID-19 declaration under Section 564(b)(1) of the Act, 21 U.S.C. section 360bbb-3(b)(1), unless the authorization is terminated or revoked sooner. Performed at [facility], [place] [address] [place], [name] [address]       Radiological Exams on Admission: Ct Head Wo Contrast  Addendum Date: 08/11/2018   ADDENDUM REPORT: 08/11/2018 18:13 ADDENDUM: Note is made of a small fracture through the right side of the posterior spinous process of C5. This is a stable fracture. These results were called by telephone at the time of interpretation on 08/11/2018 at 6:11 pm to Dr. Alroy Bailiff, who verbally acknowledged these results. Electronically Signed   By: Garald Balding M.D.   On: 08/11/2018 18:13    Result Date: 08/11/2018 CLINICAL DATA:  Status post fall off stool, with possible syncopal episode. Small laceration above the right orbit. Concern for head or cervical spine injury. Initial encounter. EXAM: CT HEAD WITHOUT CONTRAST CT CERVICAL SPINE WITHOUT CONTRAST TECHNIQUE: Multidetector CT imaging of the head and cervical spine was performed following the standard protocol without intravenous contrast. Multiplanar CT image reconstructions of the cervical spine were also generated. COMPARISON:  CT of the head performed 05/19/2015 FINDINGS: CT HEAD FINDINGS Brain: No evidence of acute infarction, hemorrhage, hydrocephalus, extra-axial collection or mass lesion / mass effect. Mild periventricular and subcortical white matter change likely reflects small vessel ischemic microangiopathy. A likely small chronic lacunar infarct is noted superior to the right caudate, new from 2017. The brainstem and fourth ventricle are within normal limits. The basal ganglia are unremarkable in appearance. The cerebral hemispheres demonstrate grossly normal gray-white differentiation. No mass effect or midline shift is seen. Vascular: No hyperdense vessel or unexpected calcification. Skull: There is no evidence of fracture; visualized osseous structures are unremarkable in appearance. Sinuses/Orbits: The visualized portions of the orbits are within normal limits. The paranasal sinuses and mastoid air cells are well-aerated. Other: Soft tissue swelling is noted overlying the right frontal calvarium. CT CERVICAL SPINE FINDINGS Alignment: Normal. Skull base and vertebrae: No acute fracture. No primary bone lesion or focal pathologic process. Soft tissues and spinal canal: No prevertebral fluid or swelling. No visible canal hematoma. Disc levels: Intervertebral disc space narrowing is noted at C3-C4 and at the lower cervical spine, with scattered anterior and posterior disc osteophyte complexes. Upper chest: The visualized lung  apices are clear. The thyroid gland is unremarkable. Other: Dense calcification is seen at  the carotid bifurcations, particularly on the right, with likely mild luminal narrowing. IMPRESSION: 1. No evidence of traumatic intracranial injury or fracture. 2. Soft tissue swelling overlying the right frontal calvarium. 3. Mild small vessel ischemic microangiopathy. 4. Mild degenerative change along the cervical spine. 5. Dense calcification at the carotid bifurcations, particularly on the right, with likely mild luminal narrowing. Carotid ultrasound would be helpful for further evaluation, when and as deemed clinically appropriate. Electronically Signed: By: Garald Balding M.D. On: 08/11/2018 18:02   Ct Cervical Spine Wo Contrast  Addendum Date: 08/11/2018   ADDENDUM REPORT: 08/11/2018 18:13 ADDENDUM: Note is made of a small fracture through the right side of the posterior spinous process of C5. This is a stable fracture. These results were called by telephone at the time of interpretation on 08/11/2018 at 6:11 pm to Dr. Alroy Bailiff, who verbally acknowledged these results. Electronically Signed   By: Garald Balding M.D.   On: 08/11/2018 18:13   Result Date: 08/11/2018 CLINICAL DATA:  Status post fall off stool, with possible syncopal episode. Small laceration above the right orbit. Concern for head or cervical spine injury. Initial encounter. EXAM: CT HEAD WITHOUT CONTRAST CT CERVICAL SPINE WITHOUT CONTRAST TECHNIQUE: Multidetector CT imaging of the head and cervical spine was performed following the standard protocol without intravenous contrast. Multiplanar CT image reconstructions of the cervical spine were also generated. COMPARISON:  CT of the head performed 05/19/2015 FINDINGS: CT HEAD FINDINGS Brain: No evidence of acute infarction, hemorrhage, hydrocephalus, extra-axial collection or mass lesion / mass effect. Mild periventricular and subcortical white matter change likely reflects small vessel ischemic  microangiopathy. A likely small chronic lacunar infarct is noted superior to the right caudate, new from 2017. The brainstem and fourth ventricle are within normal limits. The basal ganglia are unremarkable in appearance. The cerebral hemispheres demonstrate grossly normal gray-white differentiation. No mass effect or midline shift is seen. Vascular: No hyperdense vessel or unexpected calcification. Skull: There is no evidence of fracture; visualized osseous structures are unremarkable in appearance. Sinuses/Orbits: The visualized portions of the orbits are within normal limits. The paranasal sinuses and mastoid air cells are well-aerated. Other: Soft tissue swelling is noted overlying the right frontal calvarium. CT CERVICAL SPINE FINDINGS Alignment: Normal. Skull base and vertebrae: No acute fracture. No primary bone lesion or focal pathologic process. Soft tissues and spinal canal: No prevertebral fluid or swelling. No visible canal hematoma. Disc levels: Intervertebral disc space narrowing is noted at C3-C4 and at the lower cervical spine, with scattered anterior and posterior disc osteophyte complexes. Upper chest: The visualized lung apices are clear. The thyroid gland is unremarkable. Other: Dense calcification is seen at the carotid bifurcations, particularly on the right, with likely mild luminal narrowing. IMPRESSION: 1. No evidence of traumatic intracranial injury or fracture. 2. Soft tissue swelling overlying the right frontal calvarium. 3. Mild small vessel ischemic microangiopathy. 4. Mild degenerative change along the cervical spine. 5. Dense calcification at the carotid bifurcations, particularly on the right, with likely mild luminal narrowing. Carotid ultrasound would be helpful for further evaluation, when and as deemed clinically appropriate. Electronically Signed: By: Garald Balding M.D. On: 08/11/2018 18:02   Dg Chest Portable 1 View  Result Date: 08/11/2018 CLINICAL DATA:  Syncope and  collapse, fell off a bar stool onto floor, BILATERAL shoulder pain, dementia, coronary artery disease, hypertension EXAM: PORTABLE CHEST 1 VIEW COMPARISON:  Portable exam 1653 hours compared to 05/19/2015 FINDINGS: Normal heart size post median sternotomy. Mediastinal contours and pulmonary  vascularity normal. Lungs clear. No infiltrate, pleural effusion or pneumothorax. Bones unremarkable. IMPRESSION: No acute abnormalities. Electronically Signed   By: Lavonia Dana M.D.   On: 08/11/2018 18:09   Dg Shoulder Left Port  Result Date: 08/11/2018 CLINICAL DATA:  Fall EXAM: LEFT SHOULDER - 1 VIEW COMPARISON:  Portable exam 1653 hours without priors for comparison FINDINGS: Mild degenerative changes at LEFT [facility] joint. Osseous demineralization. No fracture, dislocation or bone destruction seen on single AP view. IMPRESSION: No acute osseous abnormalities. Mild degenerative changes of the LEFT AC joint. Electronically Signed   By: Lavonia Dana M.D.   On: 08/11/2018 18:20   Dg Shoulder Right Port  Result Date: 08/11/2018 CLINICAL DATA:  Fall EXAM: PORTABLE RIGHT SHOULDER COMPARISON:  Portable exam 9741 hours without priors for comparison FINDINGS: Osseous demineralization. AC joint alignment normal. No fracture, dislocation or bone destruction identified on single AP view. IMPRESSION: No acute abnormalities. Electronically Signed   By: Lavonia Dana M.D.   On: 08/11/2018 18:20    EKG: Independently reviewed.   Active Problems:   * No active hospital problems. *   Assessment/Plan Syncope: Etiology is unclear Patient is not able to participating history taking Place patient in observation Telemetry monitoring EKG reveals sinus bradycardia Cardiac enzymes have been negative, at this point prior history of CAD and CABG. History of carotid endarterectomy noted.  Will proceed with carotid Doppler ultrasound. Echocardiogram Low threshold to consider MRI brain Urinalysis TSH PT OT Further management will  depend on hospital course  Coronary tree disease status post CABG: No chest pain Negative troponin.  Bradycardia:  Follow TSH.   Telemetry monitoring.   May be possible cause for the syncope.    Dementia:  Patient's wife tells me that patient has had dementia for about 5 years.   No behavioral problems  Manage expectantly.    Hypertension: Continue to optimize.  Chronic kidney disease stage IIIa: Stable.  At baseline. Continue to monitor closely.  C5 spinal fracture: Neurosurgery has been consulted. Soft neck collar recommended. We will defer management to the neurosurgery group.  Management will depend on hospital course.  DVT prophylaxis: Subcutaneous Lovenox Code Status: DO NOT RESUSCITATE Family Communication: Wife Disposition Plan: Will depend on hospital course Consults called: ER has consulted neurosurgery for C5 fracture. Admission status: Observation  Time spent: 65 minutes  Dana Allan, MD  [facility] Pager #: [phone] 7PM-7AM contact night coverage as above  08/11/2018, 7:51 PM

## 2018-08-12 ENCOUNTER — Observation Stay (HOSPITAL_BASED_OUTPATIENT_CLINIC_OR_DEPARTMENT_OTHER): Payer: Medicare Other

## 2018-08-12 DIAGNOSIS — I340 Nonrheumatic mitral (valve) insufficiency: Secondary | ICD-10-CM | POA: Diagnosis not present

## 2018-08-12 DIAGNOSIS — R55 Syncope and collapse: Secondary | ICD-10-CM

## 2018-08-12 LAB — CBC
HCT: 43.4 % (ref 39.0–52.0)
Hemoglobin: 14.5 g/dL (ref 13.0–17.0)
MCH: 31.9 pg (ref 26.0–34.0)
MCHC: 33.4 g/dL (ref 30.0–36.0)
MCV: 95.6 fL (ref 80.0–100.0)
Platelets: 183 10*3/uL (ref 150–400)
RBC: 4.54 MIL/uL (ref 4.22–5.81)
RDW: 12.1 % (ref 11.5–15.5)
WBC: 9.2 10*3/uL (ref 4.0–10.5)
nRBC: 0 % (ref 0.0–0.2)

## 2018-08-12 LAB — URINALYSIS, COMPLETE (UACMP) WITH MICROSCOPIC
Bacteria, UA: NONE SEEN
Bilirubin Urine: NEGATIVE
Glucose, UA: NEGATIVE mg/dL
Hgb urine dipstick: NEGATIVE
Ketones, ur: NEGATIVE mg/dL
Leukocytes,Ua: NEGATIVE
Nitrite: NEGATIVE
Protein, ur: 30 mg/dL — AB
Specific Gravity, Urine: 1.02 (ref 1.005–1.030)
pH: 6 (ref 5.0–8.0)

## 2018-08-12 LAB — BASIC METABOLIC PANEL
Anion gap: 9 (ref 5–15)
BUN: 19 mg/dL (ref 8–23)
CO2: 28 mmol/L (ref 22–32)
Calcium: 9.3 mg/dL (ref 8.9–10.3)
Chloride: 102 mmol/L (ref 98–111)
Creatinine, Ser: 1.13 mg/dL (ref 0.61–1.24)
GFR calc Af Amer: >60 mL/min (ref >60)
GFR calc non Af Amer: >60 mL/min (ref >60)
Glucose, Bld: 109 mg/dL — ABNORMAL HIGH (ref 70–99)
Potassium: 4.2 mmol/L (ref 3.5–5.1)
Sodium: 139 mmol/L (ref 135–145)

## 2018-08-12 LAB — ECHOCARDIOGRAM COMPLETE

## 2018-08-12 LAB — HIV ANTIBODY (ROUTINE TESTING W REFLEX): HIV Screen 4th Generation wRfx: NONREACTIVE

## 2018-08-12 LAB — TSH: TSH: 1.503 u[IU]/mL (ref 0.350–4.500)

## 2018-08-12 MED ORDER — OXYCODONE HCL 5 MG PO TABS: 5.0000 mg | ORAL_TABLET | ORAL | 0 refills | Status: DC | PRN

## 2018-08-12 MED ORDER — OXYCODONE HCL 5 MG PO TABS
5.0000 mg | ORAL_TABLET | ORAL | Status: DC | PRN
  Administered 2018-08-12: 5 mg via ORAL
  Filled 2018-08-12: qty 1

## 2018-08-12 NOTE — Discharge Summary (Signed)
Physician Discharge Summary  [name] TKW:[id number] DOB: [birth year] DOA: 08/11/2018  PCP: Chesley Noon, MD  Admit date: 08/11/2018 Discharge date: 08/12/2018  Admitted From: Home Disposition:  Home  Discharge Condition:Stable CODE STATUS:DNR Diet recommendation: Heart Healthy   Brief/Interim Summary:  HPI:  73 year old male with past medical history significant for dementia, nephrolithiasis, hypertension, hyperlipidemia, headaches, GERD, arthritis, carotid endarterectomy and coronary artery disease is post CABG.  Due to dementing illness, the patient is not able to give any significant history.  History is come from patient's wife.  According to the patient's wife, the patient was sitting on a stool having his dinner and he experienced syncope.  Patient was said to have leaned towards one side and fell face down, sustaining facial lacerations.  No prior syncopes.  No prior falls.  No constitutional symptoms prior to the syncope.  No headache, no neck pain, no chest pain, no shortness of breath, no GI symptoms.  Cervical MRI is as noted below.  Patient will be admitted for further assessment and management.  Patient is bradycardic.  ED Course: On presentation to the hospital, temperature was 97.5, blood pressure of 128/80, heart rate of 50 bpm, respiratory rate of 15 and O2 sat of 100%.  Facial laceration is been sutured by the ER provider.  Work-up done through face as documented below.  Hospitalist service has been asked to admit patient for further assessment and management.   Hospital Course:  Patient's hospital course remained stable.  He remained hemodynamically stable.  Patient seen by neurosurgery for C5 spinous process fracture and did not recommend any intervention.  Soft neck collar recommended.  Patient seen by PT/OT after admission and recommended home health PT/OT.  He underwent full syncopal work-up.  EKG did not show any arrhythmia.  Echocardiogram showed ejection  fraction of 6065%, moderate concentric left ventricular hypertrophy, impaired relaxation.  Carotid Doppler did not show any significant stenosis.  Patient is hemodynamically stable for discharge to home today.  Following problems were addressed during his hospitalization:  Syncope: EKG reveals sinus bradycardia.Hear rate in the range of 50s.  He denies any lightheadedness or dizziness. Cardiac enzymes have been negative, at this point prior history of CAD and CABG. History of carotid endarterectomy noted. Echocardiogram showed ejection fraction of 6065%, moderate concentric left ventricular hypertrophy, impaired relaxation.  Carotid Doppler did not show any significant stenosis.    Coronary artery disease status post CABG: No chest pain Negative troponin.  Bradycardia:  Normal TSH.  Not symptomatic.  Echo did not show any structural or defects. On reviewing on his previous EKGs,he has chronic bradycardia with HR fluctuating in between 50-60.  He follows with cardiologist Dr. Acie Fredrickson.  We recommend to follow-up with his cardiologist as an outpatient.  Dementia:  Patient has had dementia for about 5 years.   No behavioral problems  Continue supportive care  .  Continue home meds memantine and rivastigmine.  Hypertension: Continue home meds.  Chronic kidney disease stage IIIa: Stable.  At baseline.Kidney function improved with IV fluids.  C5 spinal fracture: Neurosurgery has been consulted. Soft neck collar recommended. F/U with  neurosurgery as needed.   Discharge Diagnoses:  Active Problems:   Syncope    Discharge Instructions  Discharge Instructions    Diet - low sodium heart healthy   Complete by: As directed    Discharge instructions   Complete by: As directed    1)Please follow-up with your PCP in a week. 2)Follow up with neurosurgery in 4  weeks.  Name and number the provider has been attached.   Increase activity slowly   Complete by: As directed       Allergies as of 08/12/2018      Reactions   Oxycodone-acetaminophen Other (See Comments)   Pt saw bugs      Medication List    STOP taking these medications   rosuvastatin 10 MG tablet Commonly known as: CRESTOR     TAKE these medications   amLODipine 2.5 MG tablet Commonly known as: NORVASC Take 2.5 mg by mouth at bedtime.   aspirin EC 81 MG tablet Take 81 mg by mouth daily.   atorvastatin 40 MG tablet Commonly known as: LIPITOR Take 40 mg by mouth daily.   benazepril 10 MG tablet Commonly known as: LOTENSIN Take 10 mg by mouth at bedtime.   celecoxib 200 MG capsule Commonly known as: CELEBREX Take 200 mg by mouth daily.   citalopram 20 MG tablet Commonly known as: CELEXA Take 20 mg by mouth daily.   co-enzyme Q-10 30 MG capsule Take 30 mg by mouth daily.   lansoprazole 15 MG capsule Commonly known as: PREVACID Take 15 mg by mouth daily at 12 noon.   memantine 10 MG tablet Commonly known as: NAMENDA Take 10 mg by mouth 2 (two) times daily.   multivitamin with minerals Tabs tablet Take 1 tablet by mouth daily.   Osteo Bi-Flex Joint Shield Tabs Take 1 tablet by mouth daily.   oxyCODONE 5 MG immediate release tablet Commonly known as: Oxy IR/ROXICODONE Take 1 tablet (5 mg total) by mouth every 4 (four) hours as needed for moderate pain.   rivastigmine 4.6 mg/24hr Commonly known as: EXELON Place 4.6 mg onto the skin daily.   SAW PALMETTO PO Take 1 capsule by mouth at bedtime.   vitamin C 500 MG tablet Commonly known as: ASCORBIC ACID Take 500 mg by mouth at bedtime.      Follow-up Information    Chesley Noon, MD. Schedule an appointment as soon as possible for a visit in 1 week(s).   Specialty: Family Medicine Contact information: [place] [address] [phone]        [facility] Follow up.   Why: physical therapy Contact information: [place]  [address] [phone]        Consuella Lose, MD. Schedule an appointment as soon as possible for a visit in 4 week(s).   Specialty: Neurosurgery Contact information: [address] [place] [address] [phone]          Allergies  Allergen Reactions  . Oxycodone-Acetaminophen Other (See Comments)    Pt saw bugs    Consultations:  Neurosurgery   Procedures/Studies: Ct Head Wo Contrast  Addendum Date: 08/11/2018   ADDENDUM REPORT: 08/11/2018 18:13 ADDENDUM: Note is made of a small fracture through the right side of the posterior spinous process of C5. This is a stable fracture. These results were called by telephone at the time of interpretation on 08/11/2018 at 6:11 pm to Dr. Alroy Bailiff, who verbally acknowledged these results. Electronically Signed   By: Garald Balding M.D.   On: 08/11/2018 18:13   Result Date: 08/11/2018 CLINICAL DATA:  Status post fall off stool, with possible syncopal episode. Small laceration above the right orbit. Concern for head or cervical spine injury. Initial encounter. EXAM: CT HEAD WITHOUT CONTRAST CT CERVICAL SPINE WITHOUT CONTRAST TECHNIQUE: Multidetector CT imaging of the head  and cervical spine was performed following the standard protocol without intravenous contrast. Multiplanar CT image reconstructions of the cervical spine were also generated. COMPARISON:  CT of the head performed 05/19/2015 FINDINGS: CT HEAD FINDINGS Brain: No evidence of acute infarction, hemorrhage, hydrocephalus, extra-axial collection or mass lesion / mass effect. Mild periventricular and subcortical white matter change likely reflects small vessel ischemic microangiopathy. A likely small chronic lacunar infarct is noted superior to the right caudate, new from 2017. The brainstem and fourth ventricle are within normal limits. The basal ganglia are unremarkable in appearance. The cerebral hemispheres demonstrate grossly normal gray-white  differentiation. No mass effect or midline shift is seen. Vascular: No hyperdense vessel or unexpected calcification. Skull: There is no evidence of fracture; visualized osseous structures are unremarkable in appearance. Sinuses/Orbits: The visualized portions of the orbits are within normal limits. The paranasal sinuses and mastoid air cells are well-aerated. Other: Soft tissue swelling is noted overlying the right frontal calvarium. CT CERVICAL SPINE FINDINGS Alignment: Normal. Skull base and vertebrae: No acute fracture. No primary bone lesion or focal pathologic process. Soft tissues and spinal canal: No prevertebral fluid or swelling. No visible canal hematoma. Disc levels: Intervertebral disc space narrowing is noted at C3-C4 and at the lower cervical spine, with scattered anterior and posterior disc osteophyte complexes. Upper chest: The visualized lung apices are clear. The thyroid gland is unremarkable. Other: Dense calcification is seen at the carotid bifurcations, particularly on the right, with likely mild luminal narrowing. IMPRESSION: 1. No evidence of traumatic intracranial injury or fracture. 2. Soft tissue swelling overlying the right frontal calvarium. 3. Mild small vessel ischemic microangiopathy. 4. Mild degenerative change along the cervical spine. 5. Dense calcification at the carotid bifurcations, particularly on the right, with likely mild luminal narrowing. Carotid ultrasound would be helpful for further evaluation, when and as deemed clinically appropriate. Electronically Signed: By: Garald Balding M.D. On: 08/11/2018 18:02   Ct Cervical Spine Wo Contrast  Addendum Date: 08/11/2018   ADDENDUM REPORT: 08/11/2018 18:13 ADDENDUM: Note is made of a small fracture through the right side of the posterior spinous process of C5. This is a stable fracture. These results were called by telephone at the time of interpretation on 08/11/2018 at 6:11 pm to Dr. Alroy Bailiff, who verbally acknowledged  these results. Electronically Signed   By: Garald Balding M.D.   On: 08/11/2018 18:13   Result Date: 08/11/2018 CLINICAL DATA:  Status post fall off stool, with possible syncopal episode. Small laceration above the right orbit. Concern for head or cervical spine injury. Initial encounter. EXAM: CT HEAD WITHOUT CONTRAST CT CERVICAL SPINE WITHOUT CONTRAST TECHNIQUE: Multidetector CT imaging of the head and cervical spine was performed following the standard protocol without intravenous contrast. Multiplanar CT image reconstructions of the cervical spine were also generated. COMPARISON:  CT of the head performed 05/19/2015 FINDINGS: CT HEAD FINDINGS Brain: No evidence of acute infarction, hemorrhage, hydrocephalus, extra-axial collection or mass lesion / mass effect. Mild periventricular and subcortical white matter change likely reflects small vessel ischemic microangiopathy. A likely small chronic lacunar infarct is noted superior to the right caudate, new from 2017. The brainstem and fourth ventricle are within normal limits. The basal ganglia are unremarkable in appearance. The cerebral hemispheres demonstrate grossly normal gray-white differentiation. No mass effect or midline shift is seen. Vascular: No hyperdense vessel or unexpected calcification. Skull: There is no evidence of fracture; visualized osseous structures are unremarkable in appearance. Sinuses/Orbits: The visualized portions of the orbits are within  normal limits. The paranasal sinuses and mastoid air cells are well-aerated. Other: Soft tissue swelling is noted overlying the right frontal calvarium. CT CERVICAL SPINE FINDINGS Alignment: Normal. Skull base and vertebrae: No acute fracture. No primary bone lesion or focal pathologic process. Soft tissues and spinal canal: No prevertebral fluid or swelling. No visible canal hematoma. Disc levels: Intervertebral disc space narrowing is noted at C3-C4 and at the lower cervical spine, with scattered  anterior and posterior disc osteophyte complexes. Upper chest: The visualized lung apices are clear. The thyroid gland is unremarkable. Other: Dense calcification is seen at the carotid bifurcations, particularly on the right, with likely mild luminal narrowing. IMPRESSION: 1. No evidence of traumatic intracranial injury or fracture. 2. Soft tissue swelling overlying the right frontal calvarium. 3. Mild small vessel ischemic microangiopathy. 4. Mild degenerative change along the cervical spine. 5. Dense calcification at the carotid bifurcations, particularly on the right, with likely mild luminal narrowing. Carotid ultrasound would be helpful for further evaluation, when and as deemed clinically appropriate. Electronically Signed: By: Garald Balding M.D. On: 08/11/2018 18:02   Dg Chest Portable 1 View  Result Date: 08/11/2018 CLINICAL DATA:  Syncope and collapse, fell off a bar stool onto floor, BILATERAL shoulder pain, dementia, coronary artery disease, hypertension EXAM: PORTABLE CHEST 1 VIEW COMPARISON:  Portable exam 1653 hours compared to 05/19/2015 FINDINGS: Normal heart size post median sternotomy. Mediastinal contours and pulmonary vascularity normal. Lungs clear. No infiltrate, pleural effusion or pneumothorax. Bones unremarkable. IMPRESSION: No acute abnormalities. Electronically Signed   By: Lavonia Dana M.D.   On: 08/11/2018 18:09   Dg Shoulder Left Port  Result Date: 08/11/2018 CLINICAL DATA:  Fall EXAM: LEFT SHOULDER - 1 VIEW COMPARISON:  Portable exam 1653 hours without priors for comparison FINDINGS: Mild degenerative changes at LEFT [facility] joint. Osseous demineralization. No fracture, dislocation or bone destruction seen on single AP view. IMPRESSION: No acute osseous abnormalities. Mild degenerative changes of the LEFT AC joint. Electronically Signed   By: Lavonia Dana M.D.   On: 08/11/2018 18:20   Dg Shoulder Right Port  Result Date: 08/11/2018 CLINICAL DATA:  Fall EXAM: PORTABLE RIGHT  SHOULDER COMPARISON:  Portable exam 9323 hours without priors for comparison FINDINGS: Osseous demineralization. AC joint alignment normal. No fracture, dislocation or bone destruction identified on single AP view. IMPRESSION: No acute abnormalities. Electronically Signed   By: Lavonia Dana M.D.   On: 08/11/2018 18:20   Vas US Carotid  Result Date: 08/12/2018 Carotid Arterial Duplex Study Indications:       Syncope. Risk Factors:      Hypertension, hyperlipidemia. Comparison Study:  No prior studies. Performing Technologist: Oliver Hum RVT  Examination Guidelines: A complete evaluation includes B-mode imaging, spectral Doppler, color Doppler, and power Doppler as needed of all accessible portions of each vessel. Bilateral testing is considered an integral part of a complete examination. Limited examinations for reoccurring indications may be performed as noted.  Right Carotid Findings: +----------+--------+-------+--------+--------------------------------+--------+           PSV cm/sEDV    StenosisDescribe                        Comments                   cm/s                                                    +----------+--------+-------+--------+--------------------------------+--------+  CCA Prox  76      17             smooth and heterogenous                  +----------+--------+-------+--------+--------------------------------+--------+ CCA Distal80      26                                                      +----------+--------+-------+--------+--------------------------------+--------+ ICA Prox  114     37             smooth, heterogenous and                                                  calcific                                 +----------+--------+-------+--------+--------------------------------+--------+ ICA Distal81      30                                                       +----------+--------+-------+--------+--------------------------------+--------+ ECA       98      16                                                      +----------+--------+-------+--------+--------------------------------+--------+ +----------+--------+-------+--------+-------------------+           PSV cm/sEDV cmsDescribeArm Pressure (mmHG) +----------+--------+-------+--------+-------------------+ Subclavian120                                        +----------+--------+-------+--------+-------------------+ +---------+--------+--+--------+--+---------+ VertebralPSV cm/s40EDV cm/s13Antegrade +---------+--------+--+--------+--+---------+  Left Carotid Findings: +----------+--------+--------+--------+-----------------------+--------+           PSV cm/sEDV cm/sStenosisDescribe               Comments +----------+--------+--------+--------+-----------------------+--------+ CCA Prox  89      18              smooth and heterogenous         +----------+--------+--------+--------+-----------------------+--------+ CCA Distal78      16              smooth and heterogenous         +----------+--------+--------+--------+-----------------------+--------+ ICA Prox  67      16              smooth and heterogenous         +----------+--------+--------+--------+-----------------------+--------+ ICA Distal56      18                                              +----------+--------+--------+--------+-----------------------+--------+ ECA  80      10                                              +----------+--------+--------+--------+-----------------------+--------+ +----------+--------+--------+--------+-------------------+ SubclavianPSV cm/sEDV cm/sDescribeArm Pressure (mmHG) +----------+--------+--------+--------+-------------------+           137                                         +----------+--------+--------+--------+-------------------+  +---------+--------+--+--------+-+---------+ VertebralPSV cm/s36EDV cm/s8Antegrade +---------+--------+--+--------+-+---------+  Summary: Right Carotid: Velocities in the right ICA are consistent with a 1-39% stenosis. Left Carotid: Velocities in the left ICA are consistent with a 1-39% stenosis. Vertebrals: Bilateral vertebral arteries demonstrate antegrade flow. *See table(s) above for measurements and observations.  Electronically signed by Deitra Mayo MD on 08/12/2018 at 10:38:16 AM.    Final       Subjective:   Discharge Exam: Vitals:   08/12/18 0744 08/12/18 1229  BP: 135/78 129/78  Pulse: (!) 52 (!) 52  Resp: 16 16  Temp: 97.8 F (36.6 C) 97.7 F (36.5 C)  SpO2: 97% 97%   Vitals:   08/12/18 0021 08/12/18 0401 08/12/18 0744 08/12/18 1229  BP: 109/67 109/64 135/78 129/78  Pulse: (!) 47 (!) 48 (!) 52 (!) 52  Resp: 17 18 16 16   Temp: 97.7 F (36.5 C) 97.7 F (36.5 C) 97.8 F (36.6 C) 97.7 F (36.5 C)  TempSrc: Oral Oral Oral Oral  SpO2: 97% 97% 97% 97%    General: Pt is alert, awake, not in acute distress Cardiovascular: RRR, S1/S2 +, no rubs, no gallops Respiratory: CTA bilaterally, no wheezing, no rhonchi Abdominal: Soft, NT, ND, bowel sounds + Extremities: no edema, no cyanosis    The results of significant diagnostics from this hospitalization (including imaging, microbiology, ancillary and laboratory) are listed below for reference.     Microbiology: Recent Results (from the past 240 hour(s))  SARS Coronavirus 2 (CEPHEID - Performed in [place] hospital lab), Hosp Order     Status: None   Collection Time: 08/11/18  5:53 PM   Specimen: Nasopharyngeal Swab  Result Value Ref Range Status   SARS Coronavirus 2 NEGATIVE NEGATIVE Final    Comment: (NOTE) If result is NEGATIVE SARS-CoV-2 target nucleic acids are NOT DETECTED. The SARS-CoV-2 RNA is generally detectable in upper and lower  respiratory specimens during the acute phase of infection.  The lowest  concentration of SARS-CoV-2 viral copies this assay can detect is 250  copies / mL. A negative result does not preclude SARS-CoV-2 infection  and should not be used as the sole basis for treatment or other  patient management decisions.  A negative result may occur with  improper specimen collection / handling, submission of specimen other  than nasopharyngeal swab, presence of viral mutation(s) within the  areas targeted by this assay, and inadequate number of viral copies  (<250 copies / mL). A negative result must be combined with clinical  observations, patient history, and epidemiological information. If result is POSITIVE SARS-CoV-2 target nucleic acids are DETECTED. The SARS-CoV-2 RNA is generally detectable in upper and lower  respiratory specimens dur ing the acute phase of infection.  Positive  results are indicative of active infection with SARS-CoV-2.  Clinical  correlation with patient history and other diagnostic information is  necessary to determine patient infection status.  Positive results do  not rule out bacterial infection or co-infection with other viruses. If result is PRESUMPTIVE POSTIVE SARS-CoV-2 nucleic acids MAY BE PRESENT.   A presumptive positive result was obtained on the submitted specimen  and confirmed on repeat testing.  While 2019 novel coronavirus  (SARS-CoV-2) nucleic acids may be present in the submitted sample  additional confirmatory testing may be necessary for epidemiological  and / or clinical management purposes  to differentiate between  SARS-CoV-2 and other Sarbecovirus currently known to infect humans.  If clinically indicated additional testing with an alternate test  methodology [phone]) is advised. The SARS-CoV-2 RNA is generally  detectable in upper and lower respiratory sp ecimens during the acute  phase of infection. The expected result is Negative. Fact Sheet for Patients:   StrictlyIdeas.no Fact Sheet for Healthcare Providers: BankingDealers.co.za This test is not yet approved or cleared by the [place] FDA and has been authorized for detection and/or diagnosis of SARS-CoV-2 by FDA under an Emergency Use Authorization (EUA).  This EUA will remain in effect (meaning this test can be used) for the duration of the COVID-19 declaration under Section 564(b)(1) of the Act, 21 U.S.C. section 360bbb-3(b)(1), unless the authorization is terminated or revoked sooner. Performed at [facility], [place] [address], [place], [place] [address]      Labs: BNP (last 3 results) No results for input(s): BNP in the last 8760 hours. Basic Metabolic Panel: Recent Labs  Lab 08/11/18 1638 08/11/18 1712 08/11/18 2252 08/12/18 0532  NA 136  --   --  139  K 4.1  --   --  4.2  CL 102  --   --  102  CO2 25  --   --  28  GLUCOSE 120*  --   --  109*  BUN 21  --   --  19  CREATININE 1.32*  --  1.24 1.13  CALCIUM 8.8*  --   --  9.3  MG  --  2.2 2.3  --   PHOS  --   --  4.2  --    Liver Function Tests: No results for input(s): AST, ALT, ALKPHOS, BILITOT, PROT, ALBUMIN in the last 168 hours. No results for input(s): LIPASE, AMYLASE in the last 168 hours. No results for input(s): AMMONIA in the last 168 hours. CBC: Recent Labs  Lab 08/11/18 1638 08/11/18 2252 08/12/18 0532  WBC 8.9 9.0 9.2  NEUTROABS 6.7  --   --   HGB 14.2 14.1 14.5  HCT 41.3 41.2 43.4  MCV 94.5 93.8 95.6  PLT 192 187 183   Cardiac Enzymes: No results for input(s): CKTOTAL, CKMB, CKMBINDEX, TROPONINI in the last 168 hours. BNP: Invalid input(s): POCBNP CBG: No results for input(s): GLUCAP in the last 168 hours. D-Dimer No results for input(s): DDIMER in the last 72 hours. Hgb A1c No results for input(s): HGBA1C in the last 72 hours. Lipid Profile No results for input(s): CHOL, HDL, LDLCALC, TRIG, CHOLHDL, LDLDIRECT in the last 72  hours. Thyroid function studies Recent Labs    08/11/18 2252  TSH 1.503   Anemia work up No results for input(s): VITAMINB12, FOLATE, FERRITIN, TIBC, IRON, RETICCTPCT in the last 72 hours. Urinalysis    Component Value Date/Time   COLORURINE YELLOW 08/12/2018 0248   APPEARANCEUR CLEAR 08/12/2018 0248   LABSPEC 1.020 08/12/2018 0248   PHURINE 6.0 08/12/2018 0248   GLUCOSEU NEGATIVE 08/12/2018 0248   HGBUR NEGATIVE  08/12/2018 [place] 08/12/2018 [place] 08/12/2018 0248   PROTEINUR 30 (A) 08/12/2018 0248   UROBILINOGEN 1.0 06/19/2008 0920   NITRITE NEGATIVE 08/12/2018 0248   LEUKOCYTESUR NEGATIVE 08/12/2018 0248   Sepsis Labs Invalid input(s): PROCALCITONIN,  WBC,  LACTICIDVEN Microbiology Recent Results (from the past 240 hour(s))  SARS Coronavirus 2 (CEPHEID - Performed in [place] hospital lab), Hosp Order     Status: None   Collection Time: 08/11/18  5:53 PM   Specimen: Nasopharyngeal Swab  Result Value Ref Range Status   SARS Coronavirus 2 NEGATIVE NEGATIVE Final    Comment: (NOTE) If result is NEGATIVE SARS-CoV-2 target nucleic acids are NOT DETECTED. The SARS-CoV-2 RNA is generally detectable in upper and lower  respiratory specimens during the acute phase of infection. The lowest  concentration of SARS-CoV-2 viral copies this assay can detect is 250  copies / mL. A negative result does not preclude SARS-CoV-2 infection  and should not be used as the sole basis for treatment or other  patient management decisions.  A negative result may occur with  improper specimen collection / handling, submission of specimen other  than nasopharyngeal swab, presence of viral mutation(s) within the  areas targeted by this assay, and inadequate number of viral copies  (<250 copies / mL). A negative result must be combined with clinical  observations, patient history, and epidemiological information. If result is POSITIVE SARS-CoV-2 target  nucleic acids are DETECTED. The SARS-CoV-2 RNA is generally detectable in upper and lower  respiratory specimens dur ing the acute phase of infection.  Positive  results are indicative of active infection with SARS-CoV-2.  Clinical  correlation with patient history and other diagnostic information is  necessary to determine patient infection status.  Positive results do  not rule out bacterial infection or co-infection with other viruses. If result is PRESUMPTIVE POSTIVE SARS-CoV-2 nucleic acids MAY BE PRESENT.   A presumptive positive result was obtained on the submitted specimen  and confirmed on repeat testing.  While 2019 novel coronavirus  (SARS-CoV-2) nucleic acids may be present in the submitted sample  additional confirmatory testing may be necessary for epidemiological  and / or clinical management purposes  to differentiate between  SARS-CoV-2 and other Sarbecovirus currently known to infect humans.  If clinically indicated additional testing with an alternate test  methodology [phone]) is advised. The SARS-CoV-2 RNA is generally  detectable in upper and lower respiratory sp ecimens during the acute  phase of infection. The expected result is Negative. Fact Sheet for Patients:  StrictlyIdeas.no Fact Sheet for Healthcare Providers: BankingDealers.co.za This test is not yet approved or cleared by the [place] FDA and has been authorized for detection and/or diagnosis of SARS-CoV-2 by FDA under an Emergency Use Authorization (EUA).  This EUA will remain in effect (meaning this test can be used) for the duration of the COVID-19 declaration under Section 564(b)(1) of the Act, 21 U.S.C. section 360bbb-3(b)(1), unless the authorization is terminated or revoked sooner. Performed at [facility], [address], [place], [place] [address]     Please note: You were cared for by a hospitalist during your hospital stay.  Once you are discharged, your primary care physician will handle any further medical issues. Please note that NO REFILLS for any discharge medications will be authorized once you are discharged, as it is imperative that you return to your primary care physician (or establish a relationship with a primary care physician if you  do not have one) for your post hospital discharge needs so that they can reassess your need for medications and monitor your lab values.    Time coordinating discharge: 40 minutes  SIGNED:   Shelly Coss, MD  [facility] 08/12/2018, 2:25 PM Pager [phone]  If 7PM-7AM, please contact night-coverage [email or web address] Password [email or web address]

## 2018-08-12 NOTE — Progress Notes (Signed)
Orthopedic Tech Progress Note Patient Details:  [name] 19-Jun-1945 [id number]  Ortho Devices Type of Ortho Device: Soft collar Ortho Device/Splint Interventions: Application, Ordered   Post Interventions Patient Tolerated: Well Instructions Provided: Care of device, Adjustment of device   Melony Overly T 08/12/2018, 4:27 AM

## 2018-08-12 NOTE — Evaluation (Signed)
Physical Therapy Evaluation Patient Details Name: [name] MRN: [id number] DOB: 12/12/1945 Today's Date: 08/12/2018   History of Present Illness  Pt is a 73 yo male s/p fall falling onto face s/p syncope with R eye contusion, b/l shoulder pain and CT spine revealed C5 spinous pricess fx. No surgical intervention required. Pmhx: dementia, arthritis, CAD, HLD, HTN.    Clinical Impression  Patient is a 73 y/o male presenting with the above. Patient is a poor historian with wife available to supplement PLOF. Patient today with some instability with gait with reports of mild dizziness upon standing, however VSS. Light Min A for transfers and gait with 1 HHA. Wife states she will be available to assist at all times. Will recommend HHPT to ensure safety in the home environment. PT to follow acutely.      Follow Up Recommendations Home health PT;Supervision for mobility/OOB    Equipment Recommendations  None recommended by PT    Recommendations for Other Services       Precautions / Restrictions Precautions Precautions: Fall Required Braces or Orthoses: Cervical Brace Cervical Brace: Soft collar;For comfort(no immobilization needed per neuro notes) Restrictions Weight Bearing Restrictions: No      Mobility  Bed Mobility Overal bed mobility: Needs Assistance Bed Mobility: Supine to Sit     Supine to sit: Supervision     General bed mobility comments: increased time and effort  Transfers Overall transfer level: Needs assistance Equipment used: 1 person hand held assist Transfers: Sit to/from Stand Sit to Stand: Min assist         General transfer comment: light min A to stand from bedside and for initial steadying   Ambulation/Gait Ambulation/Gait assistance: Min assist;Min guard Gait Distance (Feet): 80 Feet Assistive device: 1 person hand held assist Gait Pattern/deviations: Step-through pattern;Decreased stride length;Trunk flexed Gait velocity: decreased    General Gait Details: mild instability with cueing needed for direction - wife reports this at baseline  Stairs            Wheelchair Mobility    Modified Rankin (Stroke Patients Only)       Balance Overall balance assessment: Needs assistance Sitting-balance support: Single extremity supported;Feet supported Sitting balance-Leahy Scale: Good     Standing balance support: Single extremity supported;During functional activity Standing balance-Leahy Scale: Fair                               Pertinent Vitals/Pain Pain Assessment: Faces Faces Pain Scale: Hurts little more Pain Location: shoulder Pain Descriptors / Indicators: Discomfort Pain Intervention(s): Monitored during session;Limited activity within patient's tolerance;Repositioned    Home Living Family/patient expects to be discharged to:: Private residence Living Arrangements: Spouse/significant other Available Help at Discharge: Available 24 hours/day;Family Type of Home: House Home Access: Level entry     Home Layout: One level Home Equipment: None      Prior Function Level of Independence: Needs assistance   Gait / Transfers Assistance Needed: supervisionA  ADL's / Homemaking Assistance Needed: SupervisionA - directional cues to the "right way to the bathroom"  Comments: spouse manages medications     Hand Dominance   Dominant Hand: Right    Extremity/Trunk Assessment   Upper Extremity Assessment Upper Extremity Assessment: Overall WFL for tasks assessed    Lower Extremity Assessment Lower Extremity Assessment: Overall WFL for tasks assessed    Cervical / Trunk Assessment Cervical / Trunk Assessment: Normal  Communication   Communication: No  difficulties  Cognition Arousal/Alertness: Awake/alert Behavior During Therapy: WFL for tasks assessed/performed Overall Cognitive Status: History of cognitive impairments - at baseline                                         General Comments General comments (skin integrity, edema, etc.): wife present and supportive    Exercises     Assessment/Plan    PT Assessment Patient needs continued PT services  PT Problem List Decreased strength;Decreased activity tolerance;Decreased mobility;Decreased balance;Decreased knowledge of use of DME;Decreased safety awareness       PT Treatment Interventions DME instruction;Gait training;Stair training;Functional mobility training;Therapeutic activities;Therapeutic exercise;Balance training;Neuromuscular re-education;Patient/family education    PT Goals (Current goals can be found in the Care Plan section)  Acute Rehab PT Goals Patient Stated Goal: go home soon PT Goal Formulation: With patient Time For Goal Achievement: 08/26/18 Potential to Achieve Goals: Good    Frequency Min 3X/week   Barriers to discharge        Co-evaluation               AM-PAC PT "6 Clicks" Mobility  Outcome Measure Help needed turning from your back to your side while in a flat bed without using bedrails?: A Little Help needed moving from lying on your back to sitting on the side of a flat bed without using bedrails?: A Little Help needed moving to and from a bed to a chair (including a wheelchair)?: A Little Help needed standing up from a chair using your arms (e.g., wheelchair or bedside chair)?: A Little Help needed to walk in hospital room?: A Little Help needed climbing 3-5 steps with a railing? : A Little 6 Click Score: 18    End of Session Equipment Utilized During Treatment: Gait belt;Cervical collar Activity Tolerance: Patient tolerated treatment well Patient left: in chair;with call bell/phone within reach;with chair alarm set;with family/visitor present Nurse Communication: Mobility status PT Visit Diagnosis: Other abnormalities of gait and mobility (R26.89);Unsteadiness on feet (R26.81);Muscle weakness (generalized) (M62.81);History of falling  (Z91.81)    Time: 0037-0488 PT Time Calculation (min) (ACUTE ONLY): 29 min   Charges:   PT Evaluation $PT Eval Moderate Complexity: 1 Mod      Lanney Gins, PT, DPT Supplemental Physical Therapist 08/12/18 12:27 PM Pager: [phone] Office: [phone]

## 2018-08-12 NOTE — TOC Initial Note (Signed)
Transition of Care [facility]) - Initial/Assessment Note    Patient Details  Name: [name] MRN: [id number] Date of Birth: [birth year]  Transition of Care [facility]) CM/SW Contact:    Bartholomew Crews, RN Phone Number: [phone] 08/12/2018, 2:07 PM  Clinical Narrative:                 Spoke with patient and spouse at bedside. Discussed recommendations for [facility] PT - in agreement. Choice of [place] agency offered. Referral placed to [facility] - accepted. No DME at home or needed. No concerns about transportation, medical appointments, or medications. Spouse did ask about test results - advised that MD will provide that information. No further transition of care needs at this time.   Expected Discharge Plan: [place] Barriers to Discharge: Continued Medical Work up   Patient Goals and CMS Choice Patient states their goals for this hospitalization and ongoing recovery are:: wanting to go home today CMS Medicare.gov Compare Post Acute Care list provided to:: Patient Choice offered to / list presented to : Patient, Spouse  Expected Discharge Plan and Services Expected Discharge Plan: [place] In-house Referral: NA Discharge Planning Services: CM Consult Post Acute Care Choice: [place] arrangements for the past 2 months: Single Family Home                 DME Arranged: N/A DME Agency: NA       HH Arranged: PT HH Agency: [place] ([place]) Date HH Agency Contacted: 08/12/18 Time [place]: 1406 Representative spoke with at [place]: Butch Penny  Prior Living Arrangements/Services Living arrangements for the past 2 months: [place] with:: Self, Spouse Patient language and need for interpreter reviewed:: Yes Do you feel safe going back to the place where you live?: Yes      Need for Family Participation in Patient Care: Yes (Comment) Care giver support system in place?: Yes (comment)   Criminal  Activity/Legal Involvement Pertinent to Current Situation/Hospitalization: No - Comment as needed  Activities of Daily Living      Permission Sought/Granted Permission sought to share information with : Family Supports Permission granted to share information with : Yes, Verbal Permission Granted        Permission granted to share info w Relationship: spouse     Emotional Assessment Appearance:: Appears stated age Attitude/Demeanor/Rapport: Engaged Affect (typically observed): Accepting Orientation: : Oriented to Situation, Oriented to  Time, Oriented to Self, Oriented to Place   Psych Involvement: No (comment)  Admission diagnosis:  Syncope and collapse [R55] Fall [W19.XXXA] Laceration of skin of face, initial encounter [S01.81XA] Closed fracture of spinous process of cervical vertebra, initial encounter ([place]) [S12.9XXA] Patient Active Problem List   Diagnosis Date Noted  . Syncope 08/11/2018  . Memory loss   . Pyrexia   . Chest pain 05/19/2015  . Headache 05/19/2015  . Pulsatile abdominal mass 05/17/2012  . Hyperlipidemia 11/09/2011  . Sinus bradycardia 11/09/2011  . HTN (hypertension) 09/18/2010  . CAD (coronary artery disease) 09/18/2010   PCP:  Chesley Noon, MD Pharmacy:   [facility] [place] - [address] [place] [address] Phone: [phone] Fax: [phone]     Social Determinants of Health (SDOH) Interventions    Readmission Risk Interventions No flowsheet data found.

## 2018-08-12 NOTE — Evaluation (Addendum)
Occupational Therapy Evaluation Patient Details Name: [name] MRN: [id number] DOB: 30-Jul-1945 Today's Date: 08/12/2018    History of Present Illness Pt is a 73 yo male s/p fall falling onto face s/p syncope with R eye contusion, b/l shoulder pain and CT spine revealed C5 spinous pricess fx. No surgical intervention required. Pmhx: dementia, arthritis, CAD, HLD, HTN.   Clinical Impression   Pt PTA: pt living with spouse and nearly independent for ADL.Pt with memory deficits at baseline. Pt currently performing ADL tasks with minA overall and mobility with minA for initial standing balance and minguardA for stability with handheldA. Pt would benefit from continued OT skilled services for ADL, mobility and safety in [place] setting. OT following acutely. Spouse present. Pt following all commands with increased cueing.   BP w/ activity with lightheadedness 150/82; after 57mins rest 134/85.      Follow Up Recommendations  Home health OT    Equipment Recommendations  None recommended by OT    Recommendations for Other Services       Precautions / Restrictions Precautions Precautions: Fall Required Braces or Orthoses: Cervical Brace Cervical Brace: Soft collar;For comfort Restrictions Weight Bearing Restrictions: No      Mobility Bed Mobility Overal bed mobility: Needs Assistance Bed Mobility: Supine to Sit     Supine to sit: Supervision     General bed mobility comments: increased time and effort  Transfers Overall transfer level: Needs assistance Equipment used: 1 person hand held assist Transfers: Sit to/from Stand Sit to Stand: Min assist         General transfer comment: requires assist for initial standing balance    Balance Overall balance assessment: Needs assistance Sitting-balance support: Single extremity supported Sitting balance-Leahy Scale: Good     Standing balance support: Single extremity supported Standing balance-Leahy Scale: Fair                              ADL either performed or assessed with clinical judgement   ADL Overall ADL's : At baseline                                       General ADL Comments: Pt requires increased time and cueing/set-upA from spouse to complete tasks due to dementia. Pt reports dizziness, BP in general comments.     Vision Baseline Vision/History: Wears glasses Wears Glasses: At all times Vision Assessment?: No apparent visual deficits     Perception     Praxis      Pertinent Vitals/Pain Pain Assessment: Faces Faces Pain Scale: Hurts little more Pain Location: shoulder Pain Descriptors / Indicators: Discomfort Pain Intervention(s): Monitored during session;Limited activity within patient's tolerance;Repositioned     Hand Dominance Right   Extremity/Trunk Assessment Upper Extremity Assessment Upper Extremity Assessment: Overall WFL for tasks assessed   Lower Extremity Assessment Lower Extremity Assessment: Overall WFL for tasks assessed   Cervical / Trunk Assessment Cervical / Trunk Assessment: Normal   Communication Communication Communication: No difficulties   Cognition Arousal/Alertness: Awake/alert Behavior During Therapy: WFL for tasks assessed/performed Overall Cognitive Status: History of cognitive impairments - at baseline                                     General Comments  spouse present; BP: after  activity with lightheadedness 150/82; after 41mins rest 134/85.    Exercises     Shoulder Instructions      Home Living Family/patient expects to be discharged to:: Private residence Living Arrangements: Spouse/significant other Available Help at Discharge: Available 24 hours/day;Family Type of Home: House Home Access: Level entry     Home Layout: One level     Bathroom Shower/Tub: Occupational psychologist: Handicapped height     Home Equipment: None          Prior  Functioning/Environment Level of Independence: Needs assistance  Gait / Transfers Assistance Needed: supervisionA ADL's / Homemaking Assistance Needed: SupervisionA - directional cues to the "right way to the bathroom"   Comments: spouse manages medications        OT Problem List: Decreased strength;Decreased activity tolerance;Impaired balance (sitting and/or standing);Decreased coordination;Decreased safety awareness;Pain      OT Treatment/Interventions: Self-care/ADL training;Therapeutic exercise;Neuromuscular education;Therapeutic activities;Patient/family education;Balance training;Energy conservation    OT Goals(Current goals can be found in the care plan section) Acute Rehab OT Goals Patient Stated Goal: go home soon OT Goal Formulation: With patient Time For Goal Achievement: 08/26/18 Potential to Achieve Goals: Good ADL Goals Pt Will Perform Grooming: with modified independence;standing Pt Will Perform Toileting - Clothing Manipulation and hygiene: with supervision;sit to/from stand Additional ADL Goal #1: Pt will be supervision level for OBO ADL with fair balance.  OT Frequency: Min 2X/week   Barriers to D/C:            Co-evaluation              AM-PAC OT "6 Clicks" Daily Activity     Outcome Measure Help from another person eating meals?: None Help from another person taking care of personal grooming?: A Little Help from another person toileting, which includes using toliet, bedpan, or urinal?: A Little Help from another person bathing (including washing, rinsing, drying)?: A Little Help from another person to put on and taking off regular upper body clothing?: A Little Help from another person to put on and taking off regular lower body clothing?: A Little 6 Click Score: 19   End of Session Equipment Utilized During Treatment: Gait belt Nurse Communication: Mobility status  Activity Tolerance: Patient tolerated treatment well Patient left: in chair;with  call bell/phone within reach;with chair alarm set  OT Visit Diagnosis: Unsteadiness on feet (R26.81);Muscle weakness (generalized) (M62.81)                Time: 1962-2297 OT Time Calculation (min): 29 min Charges:  OT General Charges $OT Visit: 1 Visit OT Evaluation $OT Eval Moderate Complexity: 1 Mod  Darryl Nestle) Marsa Aris OTR/L Acute Rehabilitation Services Pager: [phone] Office: [place] 08/12/2018, 1:48 PM

## 2018-08-12 NOTE — Progress Notes (Signed)
Discharged to home via w/c to wife's car.  IV access removed.

## 2018-08-12 NOTE — Progress Notes (Signed)
2D Echocardiogram has been performed.  [name] 08/12/2018, 12:16 PM

## 2018-08-12 NOTE — Progress Notes (Signed)
Carotid artery duplex has been completed. Preliminary results can be found in CV Proc through chart review.   08/12/18 8:43 AM [name] RVT

## 2018-08-12 NOTE — Care Management Obs Status (Signed)
[place] NOTIFICATION   Patient Details  Name: [name] MRN: [id number] Date of Birth: 1945-02-06   Medicare Observation Status Notification Given:       Bartholomew Crews, RN 08/12/2018, 1:57 PM

## 2018-08-16 ENCOUNTER — Telehealth: Payer: Self-pay | Admitting: Physician Assistant

## 2018-08-16 NOTE — Telephone Encounter (Signed)
New Message         COVID-19 Pre-Screening Questions:   In the past 7 to 10 days have you had a cough,  shortness of breath, headache, congestion, fever (100 or greater) body aches, chills, sore throat, or sudden loss of taste or sense of smell? NO  Have you been around anyone with known Covid 19. NO  Have you been around anyone who is awaiting Covid 19 test results in the past 7 to 10 days? Pt was tested on Friday and it was NEG   Have you been around anyone who has been exposed to Covid 19, or has mentioned symptoms of Covid 19 within the past 7 to 10 days? NO Pts wife will be assisting pt because he has Dementia. She answered NO to all questions   If you have any concerns/questions about symptoms patients report during screening (either on the phone or at [place]). Contact the provider seeing the patient or DOD for further guidance.  If neither are available contact a member of the leadership team.

## 2018-08-17 ENCOUNTER — Ambulatory Visit (INDEPENDENT_AMBULATORY_CARE_PROVIDER_SITE_OTHER): Payer: Medicare Other | Admitting: Physician Assistant

## 2018-08-17 ENCOUNTER — Encounter: Payer: Self-pay | Admitting: Physician Assistant

## 2018-08-17 ENCOUNTER — Encounter: Payer: Self-pay | Admitting: *Deleted

## 2018-08-17 ENCOUNTER — Telehealth: Payer: Self-pay | Admitting: *Deleted

## 2018-08-17 ENCOUNTER — Other Ambulatory Visit: Payer: Self-pay

## 2018-08-17 VITALS — BP 124/70 | HR 56 | Ht 72.0 in | Wt 228.1 lb

## 2018-08-17 DIAGNOSIS — R0789 Other chest pain: Secondary | ICD-10-CM

## 2018-08-17 DIAGNOSIS — I2510 Atherosclerotic heart disease of native coronary artery without angina pectoris: Secondary | ICD-10-CM | POA: Diagnosis not present

## 2018-08-17 DIAGNOSIS — R413 Other amnesia: Secondary | ICD-10-CM

## 2018-08-17 DIAGNOSIS — R001 Bradycardia, unspecified: Secondary | ICD-10-CM | POA: Diagnosis not present

## 2018-08-17 DIAGNOSIS — I10 Essential (primary) hypertension: Secondary | ICD-10-CM

## 2018-08-17 DIAGNOSIS — R55 Syncope and collapse: Secondary | ICD-10-CM | POA: Diagnosis not present

## 2018-08-17 NOTE — Progress Notes (Signed)
Cardiology Office Note:    Date:  08/17/2018   ID:  [name], DOB 27-Apr-1945, MRN [id number]  PCP:  Chesley Noon, MD  Cardiologist:  Mertie Moores, MD   Electrophysiologist:  None   Referring MD: Chesley Noon, MD   Chief Complaint  Patient presents with   Hospitalization Follow-up    syncope, chest pain     History of Present Illness:    [name] is a 73 y.o. male with:  Coronary artery disease  Status post DES to the RCA and BMS x2 to the LCx in 2003  Status post CABG in 06/2014 ([place], [place], [place])  Carotid artery disease  Status post left CEA in 2016 ([place], [place])  Hypertension  Hyperlipidemia  Memory loss  GERD  Mr. [name] was last seen by Dr. Acie Fredrickson in January 2020.  He called the office on 7/21 with complaints of chest pain.  He was advised to go to the emergency room.  He was then admitted 7/23-7/24 with syncope.  He fell off of a stool and suffered a C5 spinous process fracture.  He was seen by neurosurgery and conservative management was recommended.  High-sensitivity troponin was negative.  Echocardiogram demonstrated normal LV function.  Carotid Dopplers demonstrated patent bilateral internal carotid arteries.  He was noted to be bradycardic.  Follow-up with cardiology was recommended.  He returns for post hospital follow up.  He is here with his wife.  Regarding the chest pain, he had about 1-2 hours of chest pain that waxed and waned.  This occurred at rest.  He did not take NTG.  He has not had any further chest pain.  Regarding his syncope, he was sitting on a stool eating lunch.  He was talking to his granddaughter when he suddenly passed out without warning.  His wife thinks he was out for 5 mins.  He did not require CPR or rescue breathing. He did not have any seizure like activity.  He did lose control of his bowels.  Since DC, he had some leg and arm jerking one day for a few  mins while sitting on the couch.  He has not had further chest pain, syncope.  He has not had significant shortness of breath but has to take a deep breath at times.   He has not had orthopnea, paroxysmal nocturnal dyspnea.    Prior CV studies:   The following studies were reviewed today:  Echocardiogram 08/12/2018  EF 60-65, moderate concentric LVH, grade 1 diastolic dysfunction normal RV SF RVSP 28.8, mild calcification of aortic valve  Carotid US 08/12/2018 Summary: Right Carotid: Velocities in the right ICA are consistent with a 1-39% stenosis. Left Carotid: Velocities in the left ICA are consistent with a 1-39% stenosis. Vertebrals: Bilateral vertebral arteries demonstrate antegrade flow.  Myoview 10/14/2009 Normal study, no ischemia, EF 65  Abdominal aorta US 05/19/2012  IMPRESSION: There is no evidence of abdominal aortic aneurysm. Aortic ectasia and atherosclerotic plaquing are present.   Past Medical History:  Diagnosis Date   Arthritis    Chest pain    Colon polyp    Coronary artery disease    post PTCA and stent of the distal RCA. He has a 3.0 mm espress 2 stent deployed at 14 atmospheres placed in [place], [place]             Dyslipidemia    Erectile dysfunction    GERD (gastroesophageal reflux disease)  Headache    Hyperlipidemia    Hypertension    Kidney stones    history    Memory loss    Skin cancer 2015   Surgical Hx: The patient  has a past surgical history that includes Coronary angioplasty with stent; Total knee arthroplasty; Back surgery; Coronary artery bypass graft (06/2014); Carotid endarterectomy; Colonoscopy; Eye surgery; Hernia repair; Joint replacement; and Polypectomy.   Current Medications: Current Meds  Medication Sig   amLODipine (NORVASC) 2.5 MG tablet Take 2.5 mg by mouth at bedtime.   aspirin EC 81 MG tablet Take 81 mg by mouth daily.   atorvastatin (LIPITOR) 40 MG tablet Take 40 mg by mouth daily.   benazepril  (LOTENSIN) 10 MG tablet Take 10 mg by mouth at bedtime.   celecoxib (CELEBREX) 200 MG capsule Take 200 mg by mouth daily.     citalopram (CELEXA) 20 MG tablet Take 20 mg by mouth daily.   co-enzyme Q-10 30 MG capsule Take 30 mg by mouth daily.   lansoprazole (PREVACID) 15 MG capsule Take 15 mg by mouth daily at 12 noon.   memantine (NAMENDA) 10 MG tablet Take 10 mg by mouth 2 (two) times daily.    Misc Natural Products (OSTEO BI-FLEX JOINT SHIELD) TABS Take 1 tablet by mouth daily.     Multiple Vitamin (MULTIVITAMIN WITH MINERALS) TABS tablet Take 1 tablet by mouth daily.   oxyCODONE (OXY IR/ROXICODONE) 5 MG immediate release tablet Take 1 tablet (5 mg total) by mouth every 4 (four) hours as needed for moderate pain.   rivastigmine (EXELON) 4.6 mg/24hr Place 4.6 mg onto the skin daily.   Saw Palmetto, Serenoa repens, (SAW PALMETTO PO) Take 1 capsule by mouth at bedtime.    vitamin C (ASCORBIC ACID) 500 MG tablet Take 500 mg by mouth at bedtime.      Allergies:   Oxycodone-acetaminophen   Social History   Tobacco Use   Smoking status: Never Smoker   Smokeless tobacco: Former Systems developer    Types: Chew  Substance Use Topics   Alcohol use: Yes    Comment: 0.6 oz per week    Drug use: No     Family Hx: The patient's family history includes Alzheimer's disease in his mother; Aortic aneurysm in his father; Cancer in his father; Coronary artery disease in his mother; Hypertension in his brother.  ROS:   Please see the history of present illness.    ROS All other systems reviewed and are negative.   EKGs/Labs/Other Test Reviewed:    EKG:  EKG is not ordered today.  The ekg ordered today demonstrates n/a ECG from 08/11/18 personally reviewed - sinus brady, HR 49, QTc 446, no acute changes.   Recent Labs: 06/22/2018: ALT 26 08/11/2018: Magnesium 2.3; TSH 1.503 08/12/2018: BUN 19; Creatinine, Ser 1.13; Hemoglobin 14.5; Platelets 183; Potassium 4.2; Sodium 139   Recent Lipid  Panel Lab Results  Component Value Date/Time   CHOL 118 06/22/2018 08:49 AM   TRIG 140 06/22/2018 08:49 AM   HDL 29 (L) 06/22/2018 08:49 AM   CHOLHDL 4.1 06/22/2018 08:49 AM   CHOLHDL 4 06/20/2013 11:18 AM   LDLCALC 61 06/22/2018 08:49 AM    Physical Exam:    VS:  BP 124/70    Pulse (!) 56    Ht 6' (1.829 m)    Wt 228 lb 1.9 oz (103.5 kg)    SpO2 96%    BMI 30.94 kg/m     Wt Readings from Last 3 Encounters:  08/17/18 228 lb 1.9 oz (103.5 kg)  01/24/18 230 lb (104.3 kg)  07/12/17 237 lb (107.5 kg)     Physical Exam  Constitutional: He is oriented to person, place, and time. He appears well-developed and well-nourished. No distress.  HENT:  Head: Normocephalic.  Periorbital ecchymosis noted on the right  Eyes: No scleral icterus.  Neck: No JVD present. No thyromegaly present.  Cardiovascular: Normal rate, regular rhythm and normal heart sounds.  No murmur heard. Pulmonary/Chest: Effort normal and breath sounds normal. He has no rales.  Abdominal: Soft. There is no hepatomegaly.  Musculoskeletal:        General: No edema.  Lymphadenopathy:    He has no cervical adenopathy.  Neurological: He is alert and oriented to person, place, and time.  Skin: Skin is warm and dry.  Psychiatric: He has a normal mood and affect.    ASSESSMENT & PLAN:    1. Syncope, unspecified syncope type Etiology not entirely clear.  I reviewed his case today with Dr. Caryl Comes to determine if ILR implantation would be better in a patient with significant dementia rather than an event monitor.  His prolonged episode of syncope likely goes against a cardiac arrhythmia.  After discussion with Dr. Caryl Comes, recommendation is to proceed with an event monitor now.  If his event monitor is unrevealing and he has recurrent syncope in the future, we could consider ILR implantation.  Of note, the patient does not drive.  -Obtain 31-VQM event monitor with continuous monitoring and live feedback  2. Coronary artery  disease involving native coronary artery of native heart without angina pectoris 3. Other chest pain History of CABG in 2016 in [place].  He recently had prolonged chest pain.  Fortunately, high-sensitivity troponin in the hospital was negative.  Given his dementia, it is difficult to assess his symptoms fully.  He did not have any acute changes on his ECG in the hospital.  I had a discussion with his wife regarding whether or not to proceed with stress testing.  She feels that the patient could undergo cardiac catheterization if his stress test were abnormal.  -Arrange Lexiscan Myoview  -Continue aspirin, statin.  4. Sinus bradycardia Obtain event monitor as noted.  5. Memory loss Refer to neurology for continued follow-up on dementia and to review medications.  Of note, he does take Rivastigmine which can contribute to bradycardia.  Defer to neurology whether or not this medication can be discontinued or reduced.  6. Essential hypertension The patient's blood pressure is controlled on his current regimen.  Continue current therapy.    Dispo:  Return in about 6 weeks (around 09/28/2018) for Follow up after testing, w/ Dr. Acie Fredrickson, or Richardson Dopp, PA-C.   Medication Adjustments/Labs and Tests Ordered: Current medicines are reviewed at length with the patient today.  Concerns regarding medicines are outlined above.  Tests Ordered: Orders Placed This Encounter  Procedures   Ambulatory referral to Neurology   MYOCARDIAL PERFUSION IMAGING   LONG TERM MONITOR-LIVE TELEMETRY (3-14 DAYS)   Medication Changes: No orders of the defined types were placed in this encounter.   Signed, Richardson Dopp, PA-C  08/17/2018 5:58 PM    [facility] [address], [place], [place]  [address] Phone: [phone]; Fax: [phone]

## 2018-08-17 NOTE — Telephone Encounter (Signed)
14 day ZIO AT long term live telemetry monitor will be mailed to patients home.  Wife instructed not to apply monitor until after the patients Nuclear medicine appointment on 08/23/2018.  The patient will have one large patch applied to his upper left chest.  This single patch cannot be removed from the patients chest for 14 days. Apply patch on 08/24/2018 turn on patch and gateway per included instructions.  Keep gateway within 10 feet of patient at all times.  He will be able to shower with patch on after 24 hours.  On 09/07/2018, remove patch.  Put patch and Gateway into white bag which is located in the bottom half of the Gateway.  Seal the bag and mail as soon as possible.  We will not receive the final report until the monitor has been returned to [facility].

## 2018-08-17 NOTE — Patient Instructions (Addendum)
Medication Instructions:   Your physician recommends that you continue on your current medications as directed. Please refer to the Current Medication list given to you today.  If you need a refill on your cardiac medications before your next appointment, please call your pharmacy.   Lab work: NONE ORDERED  TODAY'  If you have labs (blood work) drawn today and your tests are completely normal, you will receive your results only by: [name] MyChart Message (if you have MyChart) OR . A paper copy in the mail If you have any lab test that is abnormal or we need to change your treatment, we will call you to review the results.  Testing/Procedures: Your physician has requested that you have a lexiscan myoview. For further information please visit [email or web address]. Please follow instruction sheet, as given.  Your physician has recommended that you wear an event monitor. Event monitors are medical devices that record the heart's electrical activity. Doctors most often [place] these monitors to diagnose arrhythmias. Arrhythmias are problems with the speed or rhythm of the heartbeat. The monitor is a small, portable device. You can wear one while you do your normal daily activities. This is usually used to diagnose what is causing palpitations/syncope (passing out).  Follow-Up: IN 6 WEEKS WITH NAHSER/WEAVER  You have been referred to Neurology for syncope and dizziness      Any Other Special Instructions Will Be Listed Below (If Applicable).

## 2018-08-19 ENCOUNTER — Telehealth (HOSPITAL_COMMUNITY): Payer: Self-pay | Admitting: *Deleted

## 2018-08-19 NOTE — Telephone Encounter (Signed)
Patient's wife given detailed instructions per Myocardial Perfusion Study Information Sheet for the test on 08/23/18 at 8:00. Patient notified to arrive 15 minutes early and that it is imperative to arrive on time for appointment to keep from having the test rescheduled.  If you need to cancel or reschedule your appointment, please call the office within 24 hours of your appointment. . Verbalized understanding.[name]  Patient has dementia.  Wife is to assist patient during testing.

## 2018-08-23 ENCOUNTER — Encounter (HOSPITAL_COMMUNITY): Payer: Self-pay

## 2018-08-23 ENCOUNTER — Ambulatory Visit (HOSPITAL_COMMUNITY): Payer: Medicare Other | Attending: Cardiology

## 2018-08-23 ENCOUNTER — Other Ambulatory Visit: Payer: Self-pay

## 2018-08-23 DIAGNOSIS — I2510 Atherosclerotic heart disease of native coronary artery without angina pectoris: Secondary | ICD-10-CM | POA: Insufficient documentation

## 2018-08-23 LAB — MYOCARDIAL PERFUSION IMAGING
LV dias vol: 101 mL (ref 62–150)
LV sys vol: 38 mL
Peak HR: 68 {beats}/min
Rest HR: 50 {beats}/min
SDS: 0
SRS: 0
SSS: 0
TID: 1.1

## 2018-08-23 MED ORDER — REGADENOSON 0.4 MG/5ML IV SOLN
0.4000 mg | Freq: Once | INTRAVENOUS | Status: AC
  Administered 2018-08-23: 0.4 mg via INTRAVENOUS

## 2018-08-23 MED ORDER — TECHNETIUM TC 99M TETROFOSMIN IV KIT
31.4000 | PACK | Freq: Once | INTRAVENOUS | Status: AC | PRN
  Administered 2018-08-23: 31.4 via INTRAVENOUS
  Filled 2018-08-23: qty 32

## 2018-08-23 MED ORDER — TECHNETIUM TC 99M TETROFOSMIN IV KIT
10.1000 | PACK | Freq: Once | INTRAVENOUS | Status: AC | PRN
  Administered 2018-08-23: 10.1 via INTRAVENOUS
  Filled 2018-08-23: qty 11

## 2018-08-25 ENCOUNTER — Ambulatory Visit (INDEPENDENT_AMBULATORY_CARE_PROVIDER_SITE_OTHER): Payer: Medicare Other

## 2018-08-25 DIAGNOSIS — R55 Syncope and collapse: Secondary | ICD-10-CM

## 2018-09-15 ENCOUNTER — Telehealth (INDEPENDENT_AMBULATORY_CARE_PROVIDER_SITE_OTHER): Payer: Medicare Other | Admitting: Neurology

## 2018-09-15 ENCOUNTER — Encounter: Payer: Self-pay | Admitting: Neurology

## 2018-09-15 ENCOUNTER — Other Ambulatory Visit: Payer: Self-pay

## 2018-09-15 VITALS — Ht 72.0 in | Wt 226.0 lb

## 2018-09-15 DIAGNOSIS — R55 Syncope and collapse: Secondary | ICD-10-CM | POA: Diagnosis not present

## 2018-09-15 DIAGNOSIS — F03B0 Unspecified dementia, moderate, without behavioral disturbance, psychotic disturbance, mood disturbance, and anxiety: Secondary | ICD-10-CM

## 2018-09-15 DIAGNOSIS — F0390 Unspecified dementia without behavioral disturbance: Secondary | ICD-10-CM

## 2018-09-15 NOTE — Progress Notes (Signed)
Virtual Visit via Video Note The purpose of this virtual visit is to provide medical care while limiting exposure to the novel coronavirus.    Consent was obtained for video visit:  Yes.   Answered questions that patient had about telehealth interaction:  Yes.   I discussed the limitations, risks, security and privacy concerns of performing an evaluation and management service by telemedicine. I also discussed with the patient that there may be a patient responsible charge related to this service. The patient expressed understanding and agreed to proceed.  Pt location: Home Physician Location: office Name of referring provider:  Liliane Shi, PA-C I connected with [name] at patients initiation/request on 09/15/2018 at 10:30 AM EDT by video enabled telemedicine application and verified that I am speaking with the correct person using two identifiers. Pt MRN:  [id number] Pt DOB:  May 14, 1945 Video Participants:  [name];  Shiela Mayer (spouse)   History of Present Illness:  This is a 73 year old right-handed man with a history of hypertension, hyperlipidemia, CAD, dementia, presenting for evaluation of syncope that occurred last 08/09/2018. He was sitting at the bar after eating lunch when his wife noticed he lost all color in his eyes, became pale, then fell off the stool. He was unconscious for 5 minutes then woke up confused, saying he could not move. He started answering questions 10-15 minutes after he hit the floor. He was incontinent of stool, no tongue bite or focal weakness. He was admitted to [facility] where he was found to have a C5 spinous process fracture, no neurosurgical intervention recommended. Echo showed normal LV function, dopplers normal. He was noted to be bradycardic and has had a 30-day holter monitor which showed sinus rhythm, rare episodes of nonsustained SVT, rare PVCs, no serious arrhythmias. His wife reports his HR is usually in the 1s. After discharge from the  hospital, he had 2 days while sitting on the sofa where his whole body jerked 3-4 times, no associated confusion or speech difficulties. This has not recurred since. His wife reports episodes where he stares "like he is in another world," but would respond when called. He denies any olfactory/gustatory hallucinations, deja vu, rising epigastric sensation, focal numbness/tingling/weakness. He denies any headaches, dizziness, vision changes, dysarthria/dysphagia, neck/back pain, bowel/bladder dysfunction.   When asked about his memory, he states "I feel normal, I don't think it's very good." His wife manages bills and medications. He stopped driving 2 years ago. He is independent with dressing and bathing. His wife reports memory changes started 5 years ago. He saw a neurologist in [place] and was diagnosed with "pre-Alzheimer's." He has been on the rivastigmine and memantine for the past 3-4 years. His wife is not sure they are helping. She denies any paranoia or hallucinations, he is a little irritable. Sleep is good, no wandering behavior. No falls. His mother had Alzheimer's disease. He does not drink alcohol. He had a normal birth and early development.  There is no history of febrile convulsions, CNS infections such as meningitis/encephalitis, significant traumatic brain injury, neurosurgical procedures, or family history of seizures.   PAST MEDICAL HISTORY: Past Medical History:  Diagnosis Date   Arthritis    CAD (coronary artery disease) 09/18/2010   Myoview 8/20: EF 62, mild apical thinning, no ischemia, low risk study   Chest pain    Colon polyp    Coronary artery disease    post PTCA and stent of the distal RCA. He has a 3.0  mm espress 2 stent deployed at 14 atmospheres placed in [place], [place]             Dyslipidemia    Erectile dysfunction    GERD (gastroesophageal reflux disease)    Headache    Hyperlipidemia    Hypertension    Kidney stones    history    Memory  loss    Skin cancer 2015    PAST SURGICAL HISTORY: Past Surgical History:  Procedure Laterality Date   BACK SURGERY     CAROTID ENDARTERECTOMY     COLONOSCOPY     CORONARY ANGIOPLASTY WITH STENT PLACEMENT     post PTCA and stent of the distal RCA. He has a 3.0 mm espress 2 stent deployed at 14 atmospheres placed in [place], [place]  06/2014   EYE SURGERY     HERNIA REPAIR     JOINT REPLACEMENT     left knee   POLYPECTOMY     TOTAL KNEE ARTHROPLASTY     left    MEDICATIONS: Current Outpatient Medications on File Prior to Visit  Medication Sig Dispense Refill   amLODipine (NORVASC) 2.5 MG tablet Take 2.5 mg by mouth at bedtime.     aspirin EC 81 MG tablet Take 81 mg by mouth daily.     atorvastatin (LIPITOR) 40 MG tablet Take 40 mg by mouth daily.     benazepril (LOTENSIN) 10 MG tablet Take 10 mg by mouth at bedtime.     celecoxib (CELEBREX) 200 MG capsule Take 200 mg by mouth daily.       citalopram (CELEXA) 20 MG tablet Take 20 mg by mouth daily.     co-enzyme Q-10 30 MG capsule Take 30 mg by mouth daily.     lansoprazole (PREVACID) 15 MG capsule Take 15 mg by mouth daily at 12 noon.     memantine (NAMENDA) 10 MG tablet Take 10 mg by mouth 2 (two) times daily.   2   Misc Natural Products (OSTEO BI-FLEX JOINT SHIELD) TABS Take 1 tablet by mouth daily.       Multiple Vitamin (MULTIVITAMIN WITH MINERALS) TABS tablet Take 1 tablet by mouth daily.     rivastigmine (EXELON) 4.6 mg/24hr Place 4.6 mg onto the skin daily.  1   Saw Palmetto, Serenoa repens, (SAW PALMETTO PO) Take 1 capsule by mouth at bedtime.      vitamin C (ASCORBIC ACID) 500 MG tablet Take 500 mg by mouth at bedtime.      oxyCODONE (OXY IR/ROXICODONE) 5 MG immediate release tablet Take 1 tablet (5 mg total) by mouth every 4 (four) hours as needed for moderate pain. 15 tablet 0   No current facility-administered medications on file prior to visit.      ALLERGIES: Allergies  Allergen Reactions   Oxycodone-Acetaminophen Other (See Comments)    Pt saw bugs    FAMILY HISTORY: Family History  Problem Relation Age of Onset   Aortic aneurysm Father    Cancer Father        colon    Coronary artery disease Mother        with CABG   Alzheimer's disease Mother    Hypertension Brother      Observations/Objective:   Vitals:   09/15/18 1014  Weight: 226 lb (102.5 kg)  Height: 6' (1.829 m)   GEN:  The patient appears stated age and is in NAD.  Neurological examination: Patient is awake, alert, oriented to person. No aphasia or dysarthria. Reduced fluency, able to follow commands. Remote and recent memory impaired. Able to name, difficulty with repetition. Cranial nerves: Extraocular movements intact with no nystagmus. No facial asymmetry. Motor: moves all extremities symmetrically, at least anti-gravity x 4. No incoordination on finger to nose testing. Gait: narrow-based and steady, no ataxia. Negative Romberg test.  Montreal Cognitive Assessment  09/15/2018  Visuospatial/ Executive (0/5) 2  Naming (0/3) 3  Attention: Read list of digits (0/2) 2  Attention: Read list of letters (0/1) 0  Attention: Serial 7 subtraction starting at 100 (0/3) 0  Language: Repeat phrase (0/2) 0  Language : Fluency (0/1) 0  Abstraction (0/2) 1  Delayed Recall (0/5) 0  Orientation (0/6) 1  Total 9  Adjusted Score (based on education) 10    Assessment and Plan:   This is a 73 year old right-handed man with a history of hypertension, hyperlipidemia, CAD, presenting for evaluation of a syncopal episode last 08/09/2018. His wife reports he became pale then passed out with bowel incontinence, no convulsive activity. He had a few generalized body jerks after hospital discharge, none since. Etiology of syncope unclear, cardiac workup unremarkable, seizure is a possibility. Risk factor would be underlying dementia, MOCA score today 10/30. MRI brain with  and without contrast and a 1-hour EEG will be ordered to assess for focal abnormalities that increase risk for recurrent seizures. We discussed bradycardia and cholinesterase inhibitors, his wife will hold off on administering rivastigmine patch for 2 weeks and monitor heart rate, if no significant change, resume patch. Continue Memantine. He does not drive. Continue close supervision. Follow-up in 6 months, they know to call for any changes.   Follow Up Instructions:   -I discussed the assessment and treatment plan with the patient/wife. The patient/wife were provided an opportunity to ask questions and all were answered. The patient/wife agreed with the plan and demonstrated an understanding of the instructions.   The patient Hansel Feinstein were advised to call back or seek an in-person evaluation if the symptoms worsen or if the condition fails to improve as anticipated.    Cameron Sprang, MD

## 2018-09-23 ENCOUNTER — Telehealth: Payer: Self-pay | Admitting: Nurse Practitioner

## 2018-09-23 MED ORDER — NITROGLYCERIN 0.4 MG SL SUBL: 0.4000 mg | SUBLINGUAL_TABLET | SUBLINGUAL | 6 refills | Status: DC | PRN

## 2018-09-23 NOTE — Telephone Encounter (Signed)
-----   Message from Thayer Headings, MD sent at 09/22/2018  5:19 PM EDT ----- Sinus rhythm.  Rare episodes of nonsustained SVT ( 4 beat run recorded)  Rare PVCs  No serious arrhythmias

## 2018-09-23 NOTE — Telephone Encounter (Signed)
Reviewed monitor report with patient's wife per DPR. She verbalized understanding and states patient has had no further episodes of syncope. She states the medication was stopped by the urologist that Richardson Dopp, PA suggested could cause low HR (rivastigmine patch) and patient's pulse has improved since stopping. Wife reports pulse readings daily 57-62 bpm. She will bring HR and BP readings to appointment on 9/29 with Dr. Acie Fredrickson. I advised her to call back prior to appointment if patient has any further symptoms or concerns. She verbalized understanding and agreement with plan and thanked me for the call.

## 2018-10-07 ENCOUNTER — Other Ambulatory Visit: Payer: Self-pay

## 2018-10-07 ENCOUNTER — Ambulatory Visit
Admission: RE | Admit: 2018-10-07 | Discharge: 2018-10-07 | Disposition: A | Discharge status: Home / Self Care | Payer: Medicare Other | Source: Ambulatory Visit | Attending: Neurology | Admitting: Neurology

## 2018-10-07 DIAGNOSIS — F03B0 Unspecified dementia, moderate, without behavioral disturbance, psychotic disturbance, mood disturbance, and anxiety: Secondary | ICD-10-CM

## 2018-10-07 DIAGNOSIS — R55 Syncope and collapse: Secondary | ICD-10-CM

## 2018-10-07 DIAGNOSIS — F0390 Unspecified dementia without behavioral disturbance: Secondary | ICD-10-CM

## 2018-10-07 MED ORDER — GADOBENATE DIMEGLUMINE 529 MG/ML IV SOLN
20.0000 mL | Freq: Once | INTRAVENOUS | Status: AC | PRN
  Administered 2018-10-07: 20 mL via INTRAVENOUS

## 2018-10-10 ENCOUNTER — Telehealth: Payer: Self-pay | Admitting: *Deleted

## 2018-10-10 NOTE — Telephone Encounter (Signed)
-----   Message from Cameron Sprang, MD sent at 10/10/2018 10:25 AM EDT ----- Pls let wife know that the MRI brain did not show any evidence of tumor, stroke, or bleed. It showed age-related change and hardening of the small blood vessels in the brain seen in patients with BP and cholesterol issues. Proceed with EEG as discussed, thanks

## 2018-10-10 NOTE — Telephone Encounter (Signed)
Called wife and informed her of Dr. Amparo Bristol note about the MRI results (see MD note below). EEG is already ordered and front desk to call and schedule that today. Wife verbalized understanding.

## 2018-10-12 ENCOUNTER — Ambulatory Visit (INDEPENDENT_AMBULATORY_CARE_PROVIDER_SITE_OTHER): Payer: Medicare Other | Admitting: Neurology

## 2018-10-12 ENCOUNTER — Other Ambulatory Visit: Payer: Self-pay

## 2018-10-12 DIAGNOSIS — F03B0 Unspecified dementia, moderate, without behavioral disturbance, psychotic disturbance, mood disturbance, and anxiety: Secondary | ICD-10-CM

## 2018-10-12 DIAGNOSIS — F0390 Unspecified dementia without behavioral disturbance: Secondary | ICD-10-CM

## 2018-10-12 DIAGNOSIS — R55 Syncope and collapse: Secondary | ICD-10-CM | POA: Diagnosis not present

## 2018-10-18 ENCOUNTER — Ambulatory Visit (INDEPENDENT_AMBULATORY_CARE_PROVIDER_SITE_OTHER): Payer: Medicare Other | Admitting: Cardiovascular Disease

## 2018-10-18 ENCOUNTER — Encounter: Payer: Self-pay | Admitting: Cardiovascular Disease

## 2018-10-18 ENCOUNTER — Other Ambulatory Visit: Payer: Self-pay

## 2018-10-18 VITALS — BP 102/78 | HR 62 | Ht 72.0 in | Wt 232.2 lb

## 2018-10-18 DIAGNOSIS — I10 Essential (primary) hypertension: Secondary | ICD-10-CM | POA: Diagnosis not present

## 2018-10-18 DIAGNOSIS — I2510 Atherosclerotic heart disease of native coronary artery without angina pectoris: Secondary | ICD-10-CM | POA: Diagnosis not present

## 2018-10-18 DIAGNOSIS — I2583 Coronary atherosclerosis due to lipid rich plaque: Secondary | ICD-10-CM | POA: Diagnosis not present

## 2018-10-18 NOTE — Patient Instructions (Signed)
Medication Instructions:  1) STOP AMLODIPINE  Labwork: You will have fasting labs when you return in 6 months.  Follow-Up: Your provider wants you to follow-up in: 6 months with Dr. Acie Fredrickson. You will receive a reminder letter in the mail two months in advance. If you don't receive a letter, please call our office to schedule the follow-up appointment.

## 2018-10-18 NOTE — Progress Notes (Signed)
Cardiology Office Note:    Date:  10/18/2018   ID:  [name] 1945-02-02, MRN [id number]  PCP:  Chesley Noon, MD  Cardiologist:  Nahser   Referring MD: Chesley Noon, MD   Chief Complaint  Patient presents with  . Coronary Artery Disease         [name] is a 73 y.o. male with a hx of coronary artery disease and carotid artery disease.  He is status post coronary artery bypass grafting and left carotid endarterectomy in 2016.  Seen with wife, Lelon Frohlich  I last saw [name] approximately 5 years ago. Moved back to [place].  Has occasional CP - typically when he breathing .  He has some memory issues.   His wife answers many of his questions.  Not related with twisting his torso  Walks on occasion - has some chest pain with that  Last lipids - Nov. 2018  Chol = 138 Trig = 132 HDL = 30 LDL = 82  Jan. 6 2020:  [name] is seen with his wife, Lelon Frohlich.   Has had some very brief episodes of CP   Last for several minutes.  Resolves spontaneously ,  Has not had to take a NTG  Active , exercises,  Walks  His last labs from Dr. Fayrene Fearing office reveal a total cholesterol of 144.  Triglyceride levels 147.  HDL is 29.  LDL is 86.  Sept. 29, 2020   [name] is seen with his wife Lelon Frohlich.  He has progressive dementia  Lelon Frohlich has noticed that he holds his chest on occasion.  Walks around  - about a mile    Past Medical History:  Diagnosis Date  . Arthritis   . CAD (coronary artery disease) 09/18/2010   Myoview 8/20: EF 62, mild apical thinning, no ischemia, low risk study  . Chest pain   . Colon polyp   . Coronary artery disease    post PTCA and stent of the distal RCA. He has a 3.0 mm espress 2 stent deployed at 14 atmospheres placed in [place], [place]            . Dyslipidemia   . Erectile dysfunction   . GERD (gastroesophageal reflux disease)   . Headache   . Hyperlipidemia   . Hypertension   . Kidney stones    history   . Memory loss   . Skin cancer 2015     Past Surgical History:  Procedure Laterality Date  . BACK SURGERY    . CAROTID ENDARTERECTOMY    . COLONOSCOPY    . CORONARY ANGIOPLASTY WITH STENT PLACEMENT     post PTCA and stent of the distal RCA. He has a 3.0 mm espress 2 stent deployed at 14 atmospheres placed in [place], [place] GRAFT  06/2014  . EYE SURGERY    . HERNIA REPAIR    . JOINT REPLACEMENT     left knee  . POLYPECTOMY    . TOTAL KNEE ARTHROPLASTY     left    Current Medications: No outpatient medications have been marked as taking for the 10/18/18 encounter (Office Visit) with Nahser, Wonda Cheng, MD.     Allergies:   Oxycodone-acetaminophen   Social History   Socioeconomic History  . Marital status: Married    Spouse name: Not on file  . Number of children: Not on file  . Years of education:  Not on file  . Highest education level: Not on file  Occupational History  . Not on file  Social Needs  . Financial resource strain: Not on file  . Food insecurity    Worry: Not on file    Inability: Not on file  . Transportation needs    Medical: Not on file    Non-medical: Not on file  Tobacco Use  . Smoking status: Never Smoker  . Smokeless tobacco: Former Systems developer    Types: Chew  Substance and Sexual Activity  . Alcohol use: Yes    Comment: 0.6 oz per week   . Drug use: No  . Sexual activity: Not on file  Lifestyle  . Physical activity    Days per week: Not on file    Minutes per session: Not on file  . Stress: Not on file  Relationships  . Social Herbalist on phone: Not on file    Gets together: Not on file    Attends religious service: Not on file    Active member of club or organization: Not on file    Attends meetings of clubs or organizations: Not on file    Relationship status: Not on file  Other Topics Concern  . Not on file  Social History Narrative   Right handed      Highest level of edu- 12 grade      Lives with wife in single level home      Family History: The patient's family history includes Alzheimer's disease in his mother; Aortic aneurysm in his father; Cancer in his father; Coronary artery disease in his mother; Hypertension in his brother.  ROS:   Please see the history of present illness.     All other systems reviewed and are negative.    EKGs/Labs/Other Studies Reviewed:    The following studies were reviewed today:   EKG:       Recent Labs: 06/22/2018: ALT 26 08/11/2018: Magnesium 2.3; TSH 1.503 08/12/2018: BUN 19; Creatinine, Ser 1.13; Hemoglobin 14.5; Platelets 183; Potassium 4.2; Sodium 139  Recent Lipid Panel    Component Value Date/Time   CHOL 118 06/22/2018 0849   TRIG 140 06/22/2018 0849   HDL 29 (L) 06/22/2018 0849   CHOLHDL 4.1 06/22/2018 0849   CHOLHDL 4 06/20/2013 1118   VLDL 14.0 06/20/2013 1118   LDLCALC 61 06/22/2018 0849    Physical Exam: There were no vitals taken for this visit.  GEN:  Well nourished, well developed in no acute distress HEENT: Normal NECK: No JVD; No carotid bruits LYMPHATICS: No lymphadenopathy CARDIAC: RRR , no murmurs, rubs, gallops RESPIRATORY:  Clear to auscultation without rales, wheezing or rhonchi  ABDOMEN: Soft, non-tender, non-distended MUSCULOSKELETAL:  No edema; No deformity  SKIN: Warm and dry NEUROLOGIC:   Demented,   Motor and sensory intact     ASSESSMENT:    No diagnosis found. PLAN:    In order of problems listed above:  1. Chest wall pain:     2.  Hyperlipidemia::   Last lipid levels look good.  We will check fasting lipids, liver enzymes, basic metabolic profile when I see him again in 6 months.  3.  Coronary artery disease: The patient is status post coronary artery bypass grafting.  He is not having any episodes of chest pain  3.  Carotid artery disease: He status post carotid endarterectomy.  He is not had any neurologic symptoms besides progressive dementia.  5.  Episodes of syncope:  He has had some very brief episodes of  syncope.  It is unclear whether these are due to an arrhythmia or low blood pressure.  An event monitor revealed only a 4 beat run of nonsustained ventricular tachycardia.  I do not think that is contributing to his episodes of presyncope.  Blood pressure is a little low.  We will discontinue the amlodipine which should help prevent any episodes of orthostatic hypotension.  Medication Adjustments/Labs and Tests Ordered: Current medicines are reviewed at length with the patient today.  Concerns regarding medicines are outlined above.  No orders of the defined types were placed in this encounter.  No orders of the defined types were placed in this encounter.   There are no Patient Instructions on file for this visit.   Signed, Mertie Moores, MD  10/18/2018 8:00 AM    [facility]

## 2018-10-19 NOTE — Procedures (Signed)
ELECTROENCEPHALOGRAM REPORT  Date of Study: 10/12/2018  Patient's Name: [name] MRN: [id number] Date of Birth: 1945/02/09  Referring Provider: Dr. Ellouise Newer  Clinical History: This is a 73 year old man with episode of loss of consciousness with bowel incontinence, few generalized body jerks.   Medications: NORVASC) 2.5 MG tablet aspirin EC 81 MG tablet LIPITOR) 40 MG tablet LOTENSIN) 10 MG tablet CELEBREX) 200 MG capsule celecoxib CELEXA) 20 MG tablet co-enzyme Q-10 30 MG capsule PREVACID) 15 MG capsule NAMENDA) 10 MG tablet OSTEO BI-FLEX JOINT SHIELD) TABS MULTIVITAMIN WITH MINERALS) TABS tablet EXELON) 4.6 mg/24hr Saw Palmetto, Serenoa repens ASCORBIC ACID) 500 MG tablet OXY IR/ROXICODONE) 5 MG  Technical Summary: A multichannel digital 1-hour EEG recording measured by the international 10-20 system with electrodes applied with paste and impedances below 5000 ohms performed as portable with EKG monitoring in an awake and asleep patient.  Hyperventilation was not performed. Photic stimulation was performed.  The digital EEG was referentially recorded, reformatted, and digitally filtered in a variety of bipolar and referential montages for optimal display.   Description: The patient is awake and asleep during the recording.  During maximal wakefulness, there is a symmetric, medium voltage 7 Hz posterior dominant rhythm that poorly attenuates with eye opening. The background consists of diffuse 4-6 Hz theta slowing. During drowsiness and sleep, there is an increase in theta and delta slowing with poorly formed vertex waves seen. Photic stimulation did not elicit any abnormalities.  There were no epileptiform discharges or electrographic seizures seen.    EKG lead was unremarkable.  Impression: This 1-hour awake and asleep EEG is abnormal due to mild diffuse slowing of the waking background.  Clinical Correlation of the above findings indicates diffuse cerebral  dysfunction that is non-specific in etiology and can be seen with hypoxic/ischemic injury, toxic/metabolic encephalopathies, neurodegenerative disorders, or medication effect.  The absence of epileptiform discharges does not rule out a clinical diagnosis of epilepsy.  Clinical correlation is advised.   Ellouise Newer, M.D.

## 2018-10-20 ENCOUNTER — Other Ambulatory Visit: Payer: Medicare Other

## 2018-10-21 ENCOUNTER — Telehealth: Payer: Self-pay | Admitting: *Deleted

## 2018-10-21 NOTE — Telephone Encounter (Addendum)
Spoke to wife and gave her below message. Verbalizes understanding.   ----- Message from Reather Laurence, LPN sent at [id number]  1:03 PM EDT -----  ----- Message ----- From: Cameron Sprang, MD Sent: 10/19/2018   9:52 AM EDT To: Alphonzo Dublin, LPN  Pls let wife know the brain wave test did not show any seizure discharges. It showed some slowing consistent with his dementia. Thanks

## 2019-04-11 ENCOUNTER — Encounter: Payer: Self-pay | Admitting: Cardiovascular Disease

## 2019-04-11 ENCOUNTER — Ambulatory Visit (INDEPENDENT_AMBULATORY_CARE_PROVIDER_SITE_OTHER): Payer: Medicare Other | Admitting: Cardiovascular Disease

## 2019-04-11 ENCOUNTER — Other Ambulatory Visit: Payer: Self-pay

## 2019-04-11 VITALS — BP 112/76 | HR 59 | Ht 72.0 in | Wt 232.0 lb

## 2019-04-11 DIAGNOSIS — I2510 Atherosclerotic heart disease of native coronary artery without angina pectoris: Secondary | ICD-10-CM | POA: Diagnosis not present

## 2019-04-11 DIAGNOSIS — I10 Essential (primary) hypertension: Secondary | ICD-10-CM | POA: Diagnosis not present

## 2019-04-11 DIAGNOSIS — E785 Hyperlipidemia, unspecified: Secondary | ICD-10-CM | POA: Diagnosis not present

## 2019-04-11 DIAGNOSIS — I2583 Coronary atherosclerosis due to lipid rich plaque: Secondary | ICD-10-CM

## 2019-04-11 LAB — BASIC METABOLIC PANEL
BUN/Creatinine Ratio: 12 (ref 10–24)
BUN: 14 mg/dL (ref 8–27)
CO2: 26 mmol/L (ref 20–29)
Calcium: 9.5 mg/dL (ref 8.6–10.2)
Chloride: 101 mmol/L (ref 96–106)
Creatinine, Ser: 1.17 mg/dL (ref 0.76–1.27)
GFR calc Af Amer: 72 mL/min/{1.73_m2} (ref >59)
GFR calc non Af Amer: 62 mL/min/{1.73_m2} (ref >59)
Glucose: 93 mg/dL (ref 65–99)
Potassium: 4.8 mmol/L (ref 3.5–5.2)
Sodium: 141 mmol/L (ref 134–144)

## 2019-04-11 LAB — HEPATIC FUNCTION PANEL
ALT: 28 IU/L (ref 0–44)
AST: 29 IU/L (ref 0–40)
Albumin: 4.6 g/dL (ref 3.7–4.7)
Alkaline Phosphatase: 105 IU/L (ref 39–117)
Bilirubin Total: 1 mg/dL (ref 0.0–1.2)
Bilirubin, Direct: 0.28 mg/dL (ref 0.00–0.40)
Total Protein: 6.8 g/dL (ref 6.0–8.5)

## 2019-04-11 LAB — LIPID PANEL
Chol/HDL Ratio: 4.1 ratio (ref 0.0–5.0)
Cholesterol, Total: 122 mg/dL (ref 100–199)
HDL: 30 mg/dL — ABNORMAL LOW (ref >39)
LDL Chol Calc (NIH): 69 mg/dL (ref 0–99)
Triglycerides: 127 mg/dL (ref 0–149)
VLDL Cholesterol Cal: 23 mg/dL (ref 5–40)

## 2019-04-11 NOTE — Progress Notes (Signed)
Cardiology Office Note:    Date:  04/11/2019   ID:  Kristopher, Houston [birth year], MRN [id number]  PCP:  Kristopher Noon, MD  Cardiologist:  [name]   Referring MD: Kristopher Noon, MD   Chief Complaint  Patient presents with  . Coronary Artery Disease  . Hyperlipidemia         [name] is a 74 y.o. male with a hx of coronary artery disease and carotid artery disease.  He is status post coronary artery bypass grafting and left carotid endarterectomy in 2016.  Seen with wife, [name]  I last saw [name] approximately 5 years ago. Moved back to [place].  Has occasional CP - typically when he breathing .  He has some memory issues.   His wife answers many of his questions.  Not related with twisting his torso  Walks on occasion - has some chest pain with that  Last lipids - Nov. 2018  Chol = 138 Trig = 132 HDL = 30 LDL = 82  Jan. 6 2020:  [name] is seen with his wife, [name].   Has had some very brief episodes of CP   Last for several minutes.  Resolves spontaneously ,  Has not had to take a NTG  Active , exercises,  Walks  His last labs from Dr. Fayrene Fearing office reveal a total cholesterol of 144.  Triglyceride levels 147.  HDL is 29.  LDL is 86.  Sept. 29, 2020   [name] is seen with his wife [name].  He has progressive dementia  [name] has noticed that he holds his chest on occasion.  Walks around  - about a mile   April 11, 2019: [name] is seen today with his wife, and. He has a history of coronary stenting of the distal right coronary artery in [place], [place]. Is walking a little.   Gets very winded with walking -  Is much weaker than previous.    Past Medical History:  Diagnosis Date  . Arthritis   . CAD (coronary artery disease) 09/18/2010   Myoview 8/20: EF 62, mild apical thinning, no ischemia, low risk study  . Chest pain   . Colon polyp   . Coronary artery disease    post PTCA and stent of the distal RCA. He has a 3.0 mm espress 2 stent  deployed at 14 atmospheres placed in [place], [place]            . Dyslipidemia   . Erectile dysfunction   . GERD (gastroesophageal reflux disease)   . Headache   . Hyperlipidemia   . Hypertension   . Kidney stones    history   . Memory loss   . Skin cancer 2015    Past Surgical History:  Procedure Laterality Date  . BACK SURGERY    . CAROTID ENDARTERECTOMY    . COLONOSCOPY    . CORONARY ANGIOPLASTY WITH STENT PLACEMENT     post PTCA and stent of the distal RCA. He has a 3.0 mm espress 2 stent deployed at 14 atmospheres placed in [place], [place] GRAFT  06/2014  . EYE SURGERY    . HERNIA REPAIR    . JOINT REPLACEMENT     left knee  . POLYPECTOMY    . TOTAL KNEE ARTHROPLASTY     left    Current Medications: Current Meds  Medication Sig  . aspirin EC 81  MG tablet Take 81 mg by mouth daily.  [name] atorvastatin (LIPITOR) 40 MG tablet Take 40 mg by mouth daily.  . benazepril (LOTENSIN) 10 MG tablet Take 10 mg by mouth at bedtime.  . celecoxib (CELEBREX) 200 MG capsule Take 200 mg by mouth daily.    . citalopram (CELEXA) 20 MG tablet Take 20 mg by mouth daily.  [name] co-enzyme Q-10 30 MG capsule Take 30 mg by mouth daily.  . lansoprazole (PREVACID) 15 MG capsule Take 15 mg by mouth daily at 12 Houston.  . memantine (NAMENDA) 10 MG tablet Take 10 mg by mouth 2 (two) times daily.   . Misc Natural Products (OSTEO BI-FLEX JOINT SHIELD) TABS Take 1 tablet by mouth daily.    . Multiple Vitamin (MULTIVITAMIN WITH MINERALS) TABS tablet Take 1 tablet by mouth daily.  . nitroGLYCERIN (NITROSTAT) 0.4 MG SL tablet Place 1 tablet (0.4 mg total) under the tongue every 5 (five) minutes as needed for chest pain.  . Saw Palmetto, Serenoa repens, (SAW PALMETTO PO) Take 1 capsule by mouth at bedtime.   . vitamin C (ASCORBIC ACID) 500 MG tablet Take 500 mg by mouth at bedtime.      Allergies:   Oxycodone-acetaminophen   Social History   Socioeconomic History  . Marital  status: Married    Spouse name: Not on file  . Number of children: Not on file  . Years of education: Not on file  . Highest education level: Not on file  Occupational History  . Not on file  Tobacco Use  . Smoking status: Never Smoker  . Smokeless tobacco: Former Systems developer    Types: Chew  Substance and Sexual Activity  . Alcohol use: Yes    Comment: 0.6 oz per week   . Drug use: No  . Sexual activity: Not on file  Other Topics Concern  . Not on file  Social History Narrative   Right handed      Highest level of edu- 12 grade      Lives with wife in single level home   Social Determinants of Health   Financial Resource Strain:   . Difficulty of Paying Living Expenses:   Food Insecurity:   . Worried About Charity fundraiser in the Last Year:   . Arboriculturist in the Last Year:   Transportation Needs:   . Film/video editor (Medical):   [name] Lack of Transportation (Non-Medical):   Physical Activity:   . Days of Exercise per Week:   . Minutes of Exercise per Session:   Stress:   . Feeling of Stress :   Social Connections:   . Frequency of Communication with Friends and Family:   . Frequency of Social Gatherings with Friends and Family:   . Attends Religious Services:   . Active Member of Clubs or Organizations:   . Attends Archivist Meetings:   [name] Marital Status:      Family History: The patient's family history includes Alzheimer's disease in his mother; Aortic aneurysm in his father; Cancer in his father; Coronary artery disease in his mother; Hypertension in his brother.  ROS:   Please see the history of present illness.     All other systems reviewed and are negative.    EKGs/Labs/Other Studies Reviewed:    The following studies were reviewed today:      Recent Labs: 06/22/2018: ALT 26 08/11/2018: Magnesium 2.3; TSH 1.503 08/12/2018: BUN 19; Creatinine, Ser 1.13; Hemoglobin 14.5; Platelets  183; Potassium 4.2; Sodium 139  Recent Lipid Panel     Component Value Date/Time   CHOL 118 06/22/2018 0849   TRIG 140 06/22/2018 0849   HDL 29 (L) 06/22/2018 0849   CHOLHDL 4.1 06/22/2018 0849   CHOLHDL 4 06/20/2013 1118   VLDL 14.0 06/20/2013 1118   LDLCALC 61 06/22/2018 0849    Physical Exam: Blood pressure 112/76, pulse (!) 59, height 6' (1.829 m), weight 232 lb (105.2 kg), SpO2 97 %.  GEN:     Middle age male.  NAD  HEENT: Normal NECK: No JVD; No carotid bruits LYMPHATICS: No lymphadenopathy CARDIAC: RRR , no murmurs, rubs, gallops RESPIRATORY:  Clear to auscultation without rales, wheezing or rhonchi  ABDOMEN: Soft, non-tender, non-distended MUSCULOSKELETAL:  No edema; No deformity  SKIN: Warm and dry NEUROLOGIC:  Progressive dementia   EKG:    Sinus bradycardia 59 beats minute.  No ST or T wave changes.  ASSESSMENT:    1. Coronary artery disease due to lipid rich plaque   2. Essential hypertension   3. Hyperlipidemia, unspecified hyperlipidemia type    PLAN:    In order of problems listed above:  1.  Coronary artery disease: And is not having any episodes of angina.  His wife takes him out to walk as much as she can but he is becoming more more deconditioned.  His dementia is greatly affecting his ability to exercise and get out.  2.  Hyperlipidemia: Continue current medications.  Will check lipids, liver enzymes, basic metabolic profile today.     3.  Carotid artery disease:    5.  Dementia: He has progressive dementia.  We discussed the fact that we really should limit our diagnostic testing.     Medication Adjustments/Labs and Tests Ordered: Current medicines are reviewed at length with the patient today.  Concerns regarding medicines are outlined above.  Orders Placed This Encounter  Procedures  . Lipid Profile  . Hepatic function panel  . Basic Metabolic Panel (BMET)  . EKG 12-Lead   No orders of the defined types were placed in this encounter.   Patient Instructions  Medication Instructions:   Your physician recommends that you continue on your current medications as directed. Please refer to the Current Medication list given to you today.  *If you need a refill on your cardiac medications before your next appointment, please call your pharmacy*   Lab Work: TODAY - cholesterol, liver panel, basic metabolic panel If you have labs (blood work) drawn today and your tests are completely normal, you will receive your results only by: [name] MyChart Message (if you have MyChart) OR . A paper copy in the mail If you have any lab test that is abnormal or we need to change your treatment, we will call you to review the results.   Testing/Procedures: None Ordered   Follow-Up: At [facility], you and your health needs are our priority.  As part of our continuing mission to provide you with exceptional heart care, we have created designated Provider Care Teams.  These Care Teams include your primary Cardiologist (physician) and Advanced Practice Providers (APPs -  Physician Assistants and Nurse Practitioners) who all work together to provide you with the care you need, when you need it.  We recommend signing up for the patient portal called "MyChart".  Sign up information is provided on this After Visit Summary.  MyChart is used to connect with patients for Virtual Visits (Telemedicine).  Patients are able to view lab/test  results, encounter notes, upcoming appointments, etc.  Non-urgent messages can be sent to your provider as well.   To learn more about what you can do with MyChart, go to [email or web address].    Your next appointment:   1 year(s)  The format for your next appointment:   In Person  Provider:   You may see Mertie Moores, MD or one of the following Advanced Practice Providers on your designated Care Team:    Richardson Dopp, PA-C  [place]in [place], [place]  Daune Perch, [place]        Signed, Mertie Moores, MD  04/11/2019 11:04 AM    [place]

## 2019-04-11 NOTE — Patient Instructions (Signed)
Medication Instructions:  Your physician recommends that you continue on your current medications as directed. Please refer to the Current Medication list given to you today.  *If you need a refill on your cardiac medications before your next appointment, please call your pharmacy*   Lab Work: TODAY - cholesterol, liver panel, basic metabolic panel If you have labs (blood work) drawn today and your tests are completely normal, you will receive your results only by: . MyChart Message (if you have MyChart) OR . A paper copy in the mail If you have any lab test that is abnormal or we need to change your treatment, we will call you to review the results.    Testing/Procedures: None Ordered   Follow-Up: At [facility], you and your health needs are our priority.  As part of our continuing mission to provide you with exceptional heart care, we have created designated Provider Care Teams.  These Care Teams include your primary Cardiologist (physician) and Advanced Practice Providers (APPs -  Physician Assistants and Nurse Practitioners) who all work together to provide you with the care you need, when you need it.  We recommend signing up for the patient portal called "MyChart".  Sign up information is provided on this After Visit Summary.  MyChart is used to connect with patients for Virtual Visits (Telemedicine).  Patients are able to view lab/test results, encounter notes, upcoming appointments, etc.  Non-urgent messages can be sent to your provider as well.   To learn more about what you can do with MyChart, go to [email or web address].    Your next appointment:   1 year(s)  The format for your next appointment:   In Person  Provider:   You may see Philip Nahser, MD or one of the following Advanced Practice Providers on your designated Care Team:    Scott Weaver, PA-C  Vin Bhagat, PA-C  Janine Hammond, NP     

## 2020-01-14 ENCOUNTER — Other Ambulatory Visit (HOSPITAL_COMMUNITY): Payer: Medicare Other

## 2020-01-14 ENCOUNTER — Encounter (HOSPITAL_COMMUNITY): Payer: Self-pay

## 2020-01-14 ENCOUNTER — Other Ambulatory Visit: Payer: Self-pay

## 2020-01-14 ENCOUNTER — Emergency Department (HOSPITAL_COMMUNITY): Payer: Medicare Other

## 2020-01-14 ENCOUNTER — Inpatient Hospital Stay (HOSPITAL_COMMUNITY)
Admission: EM | Admit: 2020-01-14 | Discharge: 2020-02-07 | DRG: 177 | Disposition: A | Discharge status: Hospice | Payer: Medicare Other | Attending: Internal Medicine | Admitting: Internal Medicine

## 2020-01-14 DIAGNOSIS — U071 COVID-19: Secondary | ICD-10-CM | POA: Diagnosis not present

## 2020-01-14 DIAGNOSIS — E669 Obesity, unspecified: Secondary | ICD-10-CM | POA: Diagnosis present

## 2020-01-14 DIAGNOSIS — J690 Pneumonitis due to inhalation of food and vomit: Secondary | ICD-10-CM | POA: Diagnosis present

## 2020-01-14 DIAGNOSIS — R451 Restlessness and agitation: Secondary | ICD-10-CM | POA: Diagnosis present

## 2020-01-14 DIAGNOSIS — R0902 Hypoxemia: Secondary | ICD-10-CM

## 2020-01-14 DIAGNOSIS — F0281 Dementia in other diseases classified elsewhere with behavioral disturbance: Secondary | ICD-10-CM | POA: Diagnosis present

## 2020-01-14 DIAGNOSIS — R441 Visual hallucinations: Secondary | ICD-10-CM | POA: Diagnosis present

## 2020-01-14 DIAGNOSIS — Z85828 Personal history of other malignant neoplasm of skin: Secondary | ICD-10-CM

## 2020-01-14 DIAGNOSIS — G300 Alzheimer's disease with early onset: Secondary | ICD-10-CM | POA: Diagnosis present

## 2020-01-14 DIAGNOSIS — R54 Age-related physical debility: Secondary | ICD-10-CM | POA: Diagnosis present

## 2020-01-14 DIAGNOSIS — Z515 Encounter for palliative care: Secondary | ICD-10-CM

## 2020-01-14 DIAGNOSIS — J9601 Acute respiratory failure with hypoxia: Secondary | ICD-10-CM | POA: Diagnosis present

## 2020-01-14 DIAGNOSIS — Z96652 Presence of left artificial knee joint: Secondary | ICD-10-CM | POA: Diagnosis present

## 2020-01-14 DIAGNOSIS — Z6831 Body mass index (BMI) 31.0-31.9, adult: Secondary | ICD-10-CM

## 2020-01-14 DIAGNOSIS — I10 Essential (primary) hypertension: Secondary | ICD-10-CM | POA: Diagnosis present

## 2020-01-14 DIAGNOSIS — Z820 Family history of epilepsy and other diseases of the nervous system: Secondary | ICD-10-CM

## 2020-01-14 DIAGNOSIS — Z66 Do not resuscitate: Secondary | ICD-10-CM | POA: Diagnosis present

## 2020-01-14 DIAGNOSIS — Z955 Presence of coronary angioplasty implant and graft: Secondary | ICD-10-CM

## 2020-01-14 DIAGNOSIS — Z8249 Family history of ischemic heart disease and other diseases of the circulatory system: Secondary | ICD-10-CM

## 2020-01-14 DIAGNOSIS — E785 Hyperlipidemia, unspecified: Secondary | ICD-10-CM | POA: Diagnosis present

## 2020-01-14 DIAGNOSIS — K219 Gastro-esophageal reflux disease without esophagitis: Secondary | ICD-10-CM | POA: Diagnosis present

## 2020-01-14 DIAGNOSIS — R410 Disorientation, unspecified: Secondary | ICD-10-CM | POA: Diagnosis present

## 2020-01-14 DIAGNOSIS — I2510 Atherosclerotic heart disease of native coronary artery without angina pectoris: Secondary | ICD-10-CM | POA: Diagnosis present

## 2020-01-14 DIAGNOSIS — M1990 Unspecified osteoarthritis, unspecified site: Secondary | ICD-10-CM | POA: Diagnosis present

## 2020-01-14 DIAGNOSIS — G309 Alzheimer's disease, unspecified: Secondary | ICD-10-CM

## 2020-01-14 DIAGNOSIS — Z951 Presence of aortocoronary bypass graft: Secondary | ICD-10-CM

## 2020-01-14 DIAGNOSIS — Z20822 Contact with and (suspected) exposure to covid-19: Secondary | ICD-10-CM | POA: Diagnosis present

## 2020-01-14 DIAGNOSIS — T17908A Unspecified foreign body in respiratory tract, part unspecified causing other injury, initial encounter: Secondary | ICD-10-CM | POA: Diagnosis present

## 2020-01-14 DIAGNOSIS — Z885 Allergy status to narcotic agent status: Secondary | ICD-10-CM

## 2020-01-14 DIAGNOSIS — Z79899 Other long term (current) drug therapy: Secondary | ICD-10-CM

## 2020-01-14 LAB — URINALYSIS, ROUTINE W REFLEX MICROSCOPIC
Bacteria, UA: NONE SEEN
Bilirubin Urine: NEGATIVE
Glucose, UA: NEGATIVE mg/dL
Ketones, ur: NEGATIVE mg/dL
Leukocytes,Ua: NEGATIVE
Nitrite: NEGATIVE
Protein, ur: NEGATIVE mg/dL
Specific Gravity, Urine: 1.008 (ref 1.005–1.030)
pH: 5 (ref 5.0–8.0)

## 2020-01-14 LAB — I-STAT VENOUS BLOOD GAS, ED
Acid-Base Excess: 2 mmol/L (ref 0.0–2.0)
Bicarbonate: 26.3 mmol/L (ref 20.0–28.0)
Calcium, Ion: 1.16 mmol/L (ref 1.15–1.40)
HCT: 35 % — ABNORMAL LOW (ref 39.0–52.0)
Hemoglobin: 11.9 g/dL — ABNORMAL LOW (ref 13.0–17.0)
O2 Saturation: 91 %
Potassium: 3.5 mmol/L (ref 3.5–5.1)
Sodium: 141 mmol/L (ref 135–145)
TCO2: 27 mmol/L (ref 22–32)
pCO2, Ven: 38.4 mmHg — ABNORMAL LOW (ref 44.0–60.0)
pH, Ven: 7.444 — ABNORMAL HIGH (ref 7.250–7.430)
pO2, Ven: 59 mmHg — ABNORMAL HIGH (ref 32.0–45.0)

## 2020-01-14 LAB — TROPONIN I (HIGH SENSITIVITY)
Troponin I (High Sensitivity): 9 ng/L (ref <18)
Troponin I (High Sensitivity): 8 ng/L (ref <18)

## 2020-01-14 LAB — CBC WITH DIFFERENTIAL/PLATELET
Abs Immature Granulocytes: 0.01 10*3/uL (ref 0.00–0.07)
Basophils Absolute: 0 10*3/uL (ref 0.0–0.1)
Basophils Relative: 1 %
Eosinophils Absolute: 0.1 10*3/uL (ref 0.0–0.5)
Eosinophils Relative: 2 %
HCT: 38 % — ABNORMAL LOW (ref 39.0–52.0)
Hemoglobin: 12.3 g/dL — ABNORMAL LOW (ref 13.0–17.0)
Immature Granulocytes: 0 %
Lymphocytes Relative: 39 %
Lymphs Abs: 2.1 10*3/uL (ref 0.7–4.0)
MCH: 31.1 pg (ref 26.0–34.0)
MCHC: 32.4 g/dL (ref 30.0–36.0)
MCV: 96 fL (ref 80.0–100.0)
Monocytes Absolute: 0.4 10*3/uL (ref 0.1–1.0)
Monocytes Relative: 8 %
Neutro Abs: 2.7 10*3/uL (ref 1.7–7.7)
Neutrophils Relative %: 50 %
Platelets: 222 10*3/uL (ref 150–400)
RBC: 3.96 MIL/uL — ABNORMAL LOW (ref 4.22–5.81)
RDW: 13.1 % (ref 11.5–15.5)
WBC: 5.3 10*3/uL (ref 4.0–10.5)
nRBC: 0 % (ref 0.0–0.2)

## 2020-01-14 LAB — RESP PANEL BY RT-PCR (FLU A&B, COVID) ARPGX2
Influenza A by PCR: NEGATIVE
Influenza B by PCR: NEGATIVE
SARS Coronavirus 2 by RT PCR: NEGATIVE

## 2020-01-14 LAB — COMPREHENSIVE METABOLIC PANEL
ALT: 25 U/L (ref 0–44)
AST: 26 U/L (ref 15–41)
Albumin: 3.6 g/dL (ref 3.5–5.0)
Alkaline Phosphatase: 79 U/L (ref 38–126)
Anion gap: 9 (ref 5–15)
BUN: 13 mg/dL (ref 8–23)
CO2: 25 mmol/L (ref 22–32)
Calcium: 9.1 mg/dL (ref 8.9–10.3)
Chloride: 107 mmol/L (ref 98–111)
Creatinine, Ser: 1.08 mg/dL (ref 0.61–1.24)
GFR, Estimated: >60 mL/min (ref >60)
Glucose, Bld: 97 mg/dL (ref 70–99)
Potassium: 3.7 mmol/L (ref 3.5–5.1)
Sodium: 141 mmol/L (ref 135–145)
Total Bilirubin: 0.6 mg/dL (ref 0.3–1.2)
Total Protein: 6 g/dL — ABNORMAL LOW (ref 6.5–8.1)

## 2020-01-14 LAB — AMMONIA: Ammonia: 23 umol/L (ref 9–35)

## 2020-01-14 LAB — TSH: TSH: 1.749 u[IU]/mL (ref 0.350–4.500)

## 2020-01-14 MED ORDER — HALOPERIDOL LACTATE 5 MG/ML IJ SOLN
2.0000 mg | Freq: Once | INTRAMUSCULAR | Status: AC
  Administered 2020-01-14: 18:00:00 2 mg via INTRAVENOUS
  Filled 2020-01-14: qty 1

## 2020-01-14 MED ORDER — QUETIAPINE FUMARATE 50 MG PO TABS
50.0000 mg | ORAL_TABLET | Freq: Once | ORAL | Status: AC
  Administered 2020-01-15: 02:00:00 50 mg via ORAL
  Filled 2020-01-14 (×4): qty 1

## 2020-01-14 MED ORDER — HALOPERIDOL LACTATE 5 MG/ML IJ SOLN
2.0000 mg | Freq: Once | INTRAMUSCULAR | Status: AC
  Administered 2020-01-14: 20:00:00 2 mg via INTRAVENOUS
  Filled 2020-01-14: qty 1

## 2020-01-14 MED ORDER — LORAZEPAM 2 MG/ML IJ SOLN
1.0000 mg | Freq: Once | INTRAMUSCULAR | Status: AC
  Administered 2020-01-14: 17:00:00 1 mg via INTRAVENOUS
  Filled 2020-01-14: qty 1

## 2020-01-14 MED ORDER — HALOPERIDOL LACTATE 5 MG/ML IJ SOLN
5.0000 mg | Freq: Once | INTRAMUSCULAR | Status: AC
  Administered 2020-01-14: 20:00:00 5 mg via INTRAVENOUS
  Filled 2020-01-14: qty 1

## 2020-01-14 MED ORDER — HALOPERIDOL LACTATE 5 MG/ML IJ SOLN: 5.0000 mg | Freq: Once | INTRAMUSCULAR | Status: DC

## 2020-01-14 MED ORDER — LORAZEPAM 2 MG/ML IJ SOLN
1.0000 mg | Freq: Once | INTRAMUSCULAR | Status: AC
  Administered 2020-01-14: 20:00:00 1 mg via INTRAVENOUS
  Filled 2020-01-14: qty 1

## 2020-01-14 NOTE — ED Triage Notes (Signed)
Patient arrived by [facility] from home due to family requesting patient be evaluated for pain. Patient has dementia and per EMS no fever, normal intqake. At normal neuro

## 2020-01-14 NOTE — ED Notes (Signed)
Back to room.
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
## 2020-01-14 NOTE — ED Notes (Signed)
Still have not received seroquel for this patient from pharmacy, this RN has already notified pharmacist at ED.

## 2020-01-14 NOTE — ED Notes (Signed)
Patient transported to CT 

## 2020-01-14 NOTE — ED Notes (Signed)
Pt appears calm and sleepy, CT on the way to get pt for scan. Ct aware pt was given ativan and haldol.

## 2020-01-14 NOTE — ED Notes (Signed)
Received seroquel from pharmacy. Per provider, pt will have TTS eval first prior to medication.

## 2020-01-14 NOTE — ED Provider Notes (Signed)
[facility] EMERGENCY DEPARTMENT Provider Note   CSN: [id number] Arrival date & time: 01/14/20  1536     History No chief complaint on file.   [name] is a 74 y.o. male.  HPI      Level 5 caveat due to dementia.  [name] is a 74 y.o. male, with a history of GERD, hyperlipidemia, HTN, dementia, presenting to the ED brought in by his wife due to increased agitation and aggressiveness over the past couple months, however, much worse and much more destructive over the past week.  Wife also states patient seems to be experiencing visual hallucinations and paranoia, seen objects, people, animals in the room that are not present. Wife also wonders if the patient may be experiencing chest pain as sometimes he holds his right chest.  Patient currently denies any pain or complaints. He has had Covid vaccination.  Denies fever, falls, head injury, extremity weakness, vomiting, diarrhea, difficulty breathing, abdominal pain, urinary symptoms, or any other complaints.    Past Medical History:  Diagnosis Date  . Arthritis   . CAD (coronary artery disease) 09/18/2010   Myoview 8/20: EF 62, mild apical thinning, no ischemia, low risk study  . Chest pain   . Colon polyp   . Coronary artery disease    post PTCA and stent of the distal RCA. He has a 3.0 mm espress 2 stent deployed at 14 atmospheres placed in [place], [place]            . Dyslipidemia   . Erectile dysfunction   . GERD (gastroesophageal reflux disease)   . Headache   . Hyperlipidemia   . Hypertension   . Kidney stones    history   . Memory loss   . Skin cancer 2015    Patient Active Problem List   Diagnosis Date Noted  . Syncope 08/11/2018  . Memory loss   . Pyrexia   . Chest pain 05/19/2015  . Headache 05/19/2015  . Pulsatile abdominal mass 05/17/2012  . Hyperlipidemia 11/09/2011  . Sinus bradycardia 11/09/2011  . HTN (hypertension) 09/18/2010  . CAD (coronary artery disease)  09/18/2010    Past Surgical History:  Procedure Laterality Date  . BACK SURGERY    . CAROTID ENDARTERECTOMY    . COLONOSCOPY    . CORONARY ANGIOPLASTY WITH STENT PLACEMENT     post PTCA and stent of the distal RCA. He has a 3.0 mm espress 2 stent deployed at 14 atmospheres placed in [place], [place]  06/2014  . EYE SURGERY    . HERNIA REPAIR    . JOINT REPLACEMENT     left knee  . POLYPECTOMY    . TOTAL KNEE ARTHROPLASTY     left       Family History  Problem Relation Age of Onset  . Aortic aneurysm Father   . Cancer Father        colon   . Coronary artery disease Mother        with CABG  . Alzheimer's disease Mother   . Hypertension Brother     Social History   Tobacco Use  . Smoking status: Never Smoker  . Smokeless tobacco: Former Systems developer    Types: Secondary school teacher  . Vaping Use: Never used  Substance Use Topics  . Alcohol use: Yes    Comment: 0.6 oz per week   .  Drug use: No    Home Medications Prior to Admission medications   Medication Sig Start Date End Date Taking? Authorizing Provider  acetaminophen (TYLENOL) 500 MG tablet Take 500 mg by mouth daily as needed for headache (pain).   Yes [provider]  ALPRAZolam Duanne Moron) 0.5 MG tablet Take 0.5 mg by mouth See admin instructions. Take one tablet (0.5 mg) by mouth every 5-6 hours while awake 12/26/19  Yes [provider]  nitroGLYCERIN (NITROSTAT) 0.4 MG SL tablet Place 1 tablet (0.4 mg total) under the tongue every 5 (five) minutes as needed for chest pain. 09/23/18 04/11/19 Yes Nahser, Wonda Cheng, MD  QUEtiapine (SEROQUEL) 25 MG tablet Take 25 mg by mouth 2 (two) times daily. 01/04/20  Yes [provider]    Allergies    Oxycodone-acetaminophen  Review of Systems   Review of Systems  Unable to perform ROS: Dementia    Physical Exam Updated Vital Signs BP (!) 142/83   Pulse (!) 56   Temp 98 F (36.7 C) (Oral)   Resp 18   SpO2 99%    Physical Exam Vitals and nursing note reviewed.  Constitutional:      General: He is not in acute distress.    Appearance: He is well-developed. He is not diaphoretic.  HENT:     Head: Normocephalic and atraumatic.     Mouth/Throat:     Mouth: Mucous membranes are moist.     Pharynx: Oropharynx is clear.  Eyes:     Conjunctiva/sclera: Conjunctivae normal.  Cardiovascular:     Rate and Rhythm: Normal rate and regular rhythm.     Pulses: Normal pulses.          Radial pulses are 2+ on the right side and 2+ on the left side.       Posterior tibial pulses are 2+ on the right side and 2+ on the left side.     Heart sounds: Normal heart sounds.     Comments: Tactile temperature in the extremities appropriate and equal bilaterally. Pulmonary:     Effort: Pulmonary effort is normal. No respiratory distress.     Breath sounds: Normal breath sounds.  Abdominal:     Palpations: Abdomen is soft.     Tenderness: There is no abdominal tenderness. There is no guarding.  Musculoskeletal:     Cervical back: Neck supple.     Right lower leg: No edema.     Left lower leg: No edema.  Skin:    General: Skin is warm and dry.  Neurological:     Mental Status: He is alert.     Comments: Patient oriented to self only. He is intermittently agitated.  Many times, he is verbally redirectable, but sometimes requires physical redirection. Sensation grossly intact to light touch in the extremities.   Grip strengths equal bilaterally.   Strength 5/5 in all extremities.  Cranial nerves III-XII grossly intact.  Handles oral secretions without noted difficulty.  No noted phonation or speech deficit. No facial droop.   Psychiatric:        Mood and Affect: Mood and affect normal.        Speech: Speech normal.        Behavior: Behavior normal.     ED Results / Procedures / Treatments   Labs (all labs ordered are listed, but only abnormal results are displayed) Labs Reviewed  COMPREHENSIVE METABOLIC  PANEL - Abnormal; Notable for the following components:      Result Value  Total Protein 6.0 (*)    All other components within normal limits  CBC WITH DIFFERENTIAL/PLATELET - Abnormal; Notable for the following components:   RBC 3.96 (*)    Hemoglobin 12.3 (*)    HCT 38.0 (*)    All other components within normal limits  URINALYSIS, ROUTINE W REFLEX MICROSCOPIC - Abnormal; Notable for the following components:   Hgb urine dipstick SMALL (*)    All other components within normal limits  I-STAT VENOUS BLOOD GAS, ED - Abnormal; Notable for the following components:   pH, Ven 7.444 (*)    pCO2, Ven 38.4 (*)    pO2, Ven 59.0 (*)    HCT 35.0 (*)    Hemoglobin 11.9 (*)    All other components within normal limits  RESP PANEL BY RT-PCR (FLU A&B, COVID) ARPGX2  URINE CULTURE  TSH  AMMONIA  TROPONIN I (HIGH SENSITIVITY)  TROPONIN I (HIGH SENSITIVITY)     EKG EKG Interpretation  Date/Time:  Sunday January 14 2020 17:39:06 EST Ventricular Rate:  60 PR Interval:    QRS Duration: 100 QT Interval:  460 QTC Calculation: 460 R Axis:   38 Text Interpretation: Sinus rhythm normal Confirmed by Charlesetta Shanks [phone]) on 01/14/2020 7:20:56 PM   Radiology CT Head Wo Contrast  Result Date: 01/14/2020 CLINICAL DATA:  Altered mental status. EXAM: CT HEAD WITHOUT CONTRAST TECHNIQUE: Contiguous axial images were obtained from the base of the skull through the vertex without intravenous contrast. COMPARISON:  August 11, 2018 FINDINGS: Brain: There is mild cerebral atrophy with widening of the extra-axial spaces and ventricular dilatation. There are areas of decreased attenuation within the white matter tracts of the supratentorial brain, consistent with microvascular disease changes. Vascular: No hyperdense vessel or unexpected calcification. Skull: Normal. Negative for fracture or focal lesion. Sinuses/Orbits: No acute finding. Other: None. IMPRESSION: 1. Generalized cerebral atrophy. 2. No acute  intracranial abnormality. Electronically Signed   By: Virgina Norfolk M.D.   On: 01/14/2020 20:14   DG Chest Portable 1 View  Result Date: 01/14/2020 CLINICAL DATA:  Chest pain EXAM: PORTABLE CHEST 1 VIEW COMPARISON:  Portable exam 1644 hours compared to 08/11/2018 FINDINGS: Normal heart size post median sternotomy. Mediastinal contours and pulmonary vascularity normal. Atherosclerotic calcification aorta. Minimal bibasilar atelectasis. Lungs otherwise clear. No pulmonary infiltrate, pleural effusion or pneumothorax. No acute osseous findings. IMPRESSION: Minimal bibasilar atelectasis. Aortic Atherosclerosis (ICD10-I70.0). Electronically Signed   By: Lavonia Dana M.D.   On: 01/14/2020 16:54    Procedures Procedures (including critical care time)  Medications Ordered in ED Medications  QUEtiapine (SEROQUEL) tablet 50 mg (has no administration in time range)  LORazepam (ATIVAN) injection 1 mg (1 mg Intravenous Given 01/14/20 1657)  haloperidol lactate (HALDOL) injection 2 mg (2 mg Intravenous Given 01/14/20 1745)  haloperidol lactate (HALDOL) injection 2 mg (2 mg Intravenous Given 01/14/20 1936)  haloperidol lactate (HALDOL) injection 5 mg (5 mg Intravenous Given 01/14/20 1936)  LORazepam (ATIVAN) injection 1 mg (1 mg Intravenous Given 01/14/20 1936)    ED Course  I have reviewed the triage vital signs and the nursing notes.  Pertinent labs & imaging results that were available during my care of the patient were reviewed by me and considered in my medical decision making (see chart for details).  Clinical Course as of 01/15/20 0028  Sun Jan 14, 2020  1740 Repeat EKG with more accurate QTc, not prolonged at 438ms. [SJ]    Clinical Course User Index [SJ] Delayza Lungren C, PA-C  MDM Rules/Calculators/A&P                          Patient presents with increased agitation and confusion. Patient required multiple doses of IV sedation medications to allow for safety of patient and  staff.  Evaluated by behavioral health team.  Recommended for [facility].  Awaiting placement. Medically cleared.  Home medications ordered.  Findings and plan of care discussed with attending physician, Tomasa Rand, MD. Dr. Johnney Killian personally evaluated and examined this patient.  Final Clinical Impression(s) / ED Diagnoses Final diagnoses:  Alzheimer's dementia with behavioral disturbance, unspecified timing of dementia onset [facility])    Rx / DC Orders ED Discharge Orders    None       Layla Maw 01/15/20 0033    Charlesetta Shanks, MD 01/15/20 1616

## 2020-01-14 NOTE — ED Notes (Signed)
Per provider, Bp cuff is okay to be off pt.

## 2020-01-14 NOTE — ED Notes (Signed)
Provider Shawn made aware of pts status - still agitated, pulling at lines - requested for additional meds for patient.

## 2020-01-14 NOTE — ED Notes (Signed)
istat drawn and walked down to minilab.

## 2020-01-14 NOTE — ED Notes (Signed)
Spoke with pharmacy regarding request to expedite sending seroquel from main pharmacy.

## 2020-01-14 NOTE — BH Assessment (Signed)
Comprehensive Clinical Assessment (CCA) Note  01/15/2020 [name] [id number]  Pt is a 74 year old married male who presents to [name] ED accompanied by his son, [name] [id number], who participated in assessment. Pt's son reports Pt was diagnosed with early onset Alzheimer's dementia 5-6 years ago. Pt was unable to participate in assessment, as he did not appear to acknowledge the tele-cart and said very little during assessment. Pt's son states November 20, 2019 Pt had a fever of 102 degrees and was evaluated for a UTI, which was negative. He says since then Pt has been increasingly agitated, aggressive, and confused. Over the past week, Pt has been aggressive and threatening towards his wife and other people. He is having visual hallucinations of people in the house and blood on the countertops. He has been paranoid and believes people are in the house. He has been constantly walking about trying to straighten objects. Pt son reports Pt has been hitting himself and banging his head on surfaces. During assessment, Pt continually attempted to get out of bed, pull out his IV, and appeared disoriented. Pt's son repeatedly tried to redirect Pt but Pt would not stay still. Pt's son states Pt has no history of mental health treatment.   Pt is dressed in hospital gown, alert and does not appear oriented to time, place or situation. Pt speaks in a mumbled tone, at moderate volume and normal pace. Motor behavior appears restless. Eye contact is minimal.    Chief Complaint: No chief complaint on file.  Visit Diagnosis: [G30.9 +] F02.81 [Alzheimer's disease +] Major neurocognitive disorder due to Alzheimer's disease, Probable, With behavioral disturbance  DISPOSITION: Gave clinical report to Trinna Post, PA-C who recommended Pt be transferred to a geriatric psychiatry facility. Appropriate facilities will be contacted for placement. Notified Dr. Delora Fuel and Mosie Lukes, RN of  recommendation.  CCA Screening, Triage and Referral (STR)  Patient Reported Information How did you hear about [place]? No data recorded Referral name: No data recorded Referral phone number: No data recorded  Whom do you see for routine medical problems? No data recorded Practice/Facility Name: No data recorded Practice/Facility Phone Number: No data recorded Name of Contact: No data recorded Contact Number: No data recorded Contact Fax Number: No data recorded Prescriber Name: No data recorded Prescriber Address (if known): No data recorded  What Is the Reason for Your Visit/Call Today? No data recorded How Long Has This Been Causing You Problems? No data recorded What Do You Feel Would Help You the Most Today? No data recorded  Have You Recently Been in Any Inpatient Treatment (Hospital/Detox/Crisis Center/28-Day Program)? No data recorded Name/Location of Program/Hospital:No data recorded How Long Were You There? No data recorded When Were You Discharged? No data recorded  Have You Ever Received [facility] From [facility] Before? No data recorded Who Do You See at [facility]? No data recorded  Have You Recently Had Any Thoughts About Hurting Yourself? No data recorded Are You Planning to Commit Suicide/Harm Yourself At This time? No data recorded  Have you Recently Had Thoughts About [place]? No data recorded Explanation: No data recorded  Have You Used Any Alcohol or Drugs in the Past 24 Hours? No data recorded How Long Ago Did You Use Drugs or Alcohol? No data recorded What Did You Use and How Much? No data recorded  Do You Currently Have a Therapist/Psychiatrist? No data recorded Name of Therapist/Psychiatrist: No data recorded  Have You Been Recently  Discharged From Any Office Practice or Programs? No data recorded Explanation of Discharge From Practice/Program: No data recorded    CCA Screening Triage Referral Assessment Type of Contact: No data  recorded Is this Initial or Reassessment? No data recorded Date Telepsych consult ordered in [facility]:  No data recorded Time Telepsych consult ordered in [facility]:  No data recorded  Patient Reported Information Reviewed? No data recorded Patient Left Without Being Seen? No data recorded Reason for Not Completing Assessment: No data recorded  Collateral Involvement: No data recorded  Does Patient Have a [place]? No data recorded Name and Contact of Legal Guardian: No data recorded If Minor and Not Living with Parent(s), Who has Custody? No data recorded Is CPS involved or ever been involved? No data recorded Is APS involved or ever been involved? No data recorded  Patient Determined To Be At Risk for Harm To Self or Others Based on Review of Patient Reported Information or Presenting Complaint? No data recorded Method: No data recorded Availability of Means: No data recorded Intent: No data recorded Notification Required: No data recorded Additional Information for Danger to Others Potential: No data recorded Additional Comments for Danger to Others Potential: No data recorded Are There Guns or Other Weapons in Your Home? No data recorded Types of Guns/Weapons: No data recorded Are These Weapons Safely Secured?                            No data recorded Who Could Verify You Are Able To Have These Secured: No data recorded Do You Have any Outstanding Charges, Pending Court Dates, Parole/Probation? No data recorded Contacted To Inform of Risk of Harm To Self or Others: No data recorded  Location of Assessment: No data recorded  Does Patient Present under Involuntary Commitment? No data recorded IVC Papers Initial File Date: No data recorded  [place] of Residence: No data recorded  Patient Currently Receiving the Following Services: No data recorded  Determination of Need: No data recorded  Options For Referral: No data recorded    CCA  Biopsychosocial Intake/Chief Complaint:  Pt diagnosed with Alzheimers dementia. He has been increasingly agitated, aggressive and experiencing hallucinations.  Current Symptoms/Problems: Pt is restless, pulling out IV, not oriented, agitated   Patient Reported Schizophrenia/Schizoaffective Diagnosis in Past: No   Strengths: NA  Preferences: NA  Abilities: NA   Type of Services Patient Feels are Needed: NA   Initial Clinical Notes/Concerns: Pt unable to participate in assessment due to AMS.   Mental Health Symptoms Depression:  Change in energy/activity; Difficulty Concentrating; Irritability; Sleep (too much or little)   Duration of Depressive symptoms: Greater than two weeks   Mania:  Change in energy/activity; Increased Energy; Irritability; Recklessness   Anxiety:   Difficulty concentrating; Irritability; Restlessness; Sleep; Tension; Worrying   Psychosis:  Hallucinations   Duration of Psychotic symptoms: Less than six months   Trauma:  None   Obsessions:  Poor insight   Compulsions:  Disrupts with routine/functioning; "Driven" to perform behaviors/acts; Poor Insight; Repeated behaviors/mental acts   Inattention:  None   Hyperactivity/Impulsivity:  N/A   Oppositional/Defiant Behaviors:  Aggression towards people/animals   Emotional Irregularity:  N/A   Other Mood/Personality Symptoms:  NA    Mental Status Exam Appearance and self-care  Stature:  Tall   Weight:  Average weight   Clothing:  -- (In hospital gown)   Grooming:  Neglected   Cosmetic use:  None  Posture/gait:  Tense   Motor activity:  Restless   Sensorium  Attention:  Confused   Concentration:  Scattered   Orientation:  -- (Not oriented)   Recall/memory:  Defective in Recent; Defective in Remote   Affect and Mood  Affect:  Anxious   Mood:  Anxious   Relating  Eye contact:  None   Facial expression:  Anxious   Attitude toward examiner:  Uninterested   Thought and  Language  Speech flow: Paucity   Thought content:  -- (Unable to assess due to AMS.)   Preoccupation:  -- (Unable to assess due to AMS.)   Hallucinations:  Visual   Organization:  No data recorded  [employer] of Knowledge:  Average   Intelligence:  Average   Abstraction:  -- (Unable to assess due to AMS.)   Judgement:  Poor   Reality Testing:  Distorted   Insight:  Poor   Decision Making:  Confused   Social Functioning  Social Maturity:  Impulsive   Social Judgement:  Naive   Stress  Stressors:  Illness   Coping Ability:  Exhausted   Skill Deficits:  Decision making; Self-care; Self-control; Activities of daily living   Supports:  Family     Religion:    Leisure/Recreation:    Exercise/Diet: Exercise/Diet Have You Gained or Lost A Significant Amount of Weight in the Past Six Months?: No Do You Follow a Special Diet?: No Do You Have Any Trouble Sleeping?: Yes Explanation of Sleeping Difficulties: Poor sleep   CCA Employment/Education Employment/Work Situation: Employment / Work Situation Employment situation: Retired  Education:     CCA Family/Childhood History Family and Relationship History: Family history Marital status: Married Does patient have children?: Yes  Childhood History:     Child/Adolescent Assessment:     CCA Substance Use Alcohol/Drug Use: Alcohol / Drug Use Pain Medications: See MAR Prescriptions: See MAR Over the Counter: See MAR History of alcohol / drug use?: No history of alcohol / drug abuse Longest period of sobriety (when/how long): NA                         ASAM's:  Six Dimensions of Multidimensional Assessment  Dimension 1:  Acute Intoxication and/or Withdrawal Potential:      Dimension 2:  Biomedical Conditions and Complications:      Dimension 3:  Emotional, Behavioral, or Cognitive Conditions and Complications:     Dimension 4:  Readiness to Change:     Dimension 5:   Relapse, Continued use, or Continued Problem Potential:     Dimension 6:  Recovery/Living Environment:     ASAM Severity Score:    ASAM Recommended Level of Treatment:     Substance use Disorder (SUD)    Recommendations for Services/Supports/Treatments:    DSM5 Diagnoses: Patient Active Problem List   Diagnosis Date Noted  . Syncope 08/11/2018  . Memory loss   . Pyrexia   . Chest pain 05/19/2015  . Headache 05/19/2015  . Pulsatile abdominal mass 05/17/2012  . Hyperlipidemia 11/09/2011  . Sinus bradycardia 11/09/2011  . HTN (hypertension) 09/18/2010  . CAD (coronary artery disease) 09/18/2010    Patient Centered Plan: Patient is on the following Treatment Plan(s):  Impulse Control   Referrals to Alternative Service(s): Referred to Alternative Service(s):   Place:   Date:   Time:    Referred to Alternative Service(s):   Place:   Date:   Time:    Referred to  Alternative Service(s):   Place:   Date:   Time:    Referred to Alternative Service(s):   Place:   Date:   Time:     Evelena Peat, [facility]

## 2020-01-14 NOTE — ED Notes (Signed)
TTS evaluation ongoing at bedside.

## 2020-01-15 LAB — URINE CULTURE: Culture: NO GROWTH

## 2020-01-15 MED ORDER — ALPRAZOLAM 0.25 MG PO TABS
0.5000 mg | ORAL_TABLET | ORAL | Status: DC
  Administered 2020-01-15 (×2): 0.5 mg via ORAL
  Filled 2020-01-15 (×2): qty 2

## 2020-01-15 MED ORDER — ALPRAZOLAM 0.25 MG PO TABS: 0.5000 mg | ORAL_TABLET | ORAL | Status: DC

## 2020-01-15 MED ORDER — QUETIAPINE FUMARATE 25 MG PO TABS
25.0000 mg | ORAL_TABLET | Freq: Two times a day (BID) | ORAL | Status: DC
  Administered 2020-01-15: 11:00:00 25 mg via ORAL
  Filled 2020-01-15 (×2): qty 1

## 2020-01-15 MED ORDER — HALOPERIDOL LACTATE 5 MG/ML IJ SOLN
5.0000 mg | Freq: Once | INTRAMUSCULAR | Status: AC
  Administered 2020-01-15: 09:00:00 5 mg via INTRAVENOUS
  Filled 2020-01-15: qty 1

## 2020-01-15 MED ORDER — QUETIAPINE FUMARATE 25 MG PO TABS
25.0000 mg | ORAL_TABLET | Freq: Three times a day (TID) | ORAL | Status: DC
  Administered 2020-01-15 – 2020-01-16 (×3): 25 mg via ORAL
  Filled 2020-01-15 (×3): qty 1

## 2020-01-15 MED ORDER — HALOPERIDOL 5 MG PO TABS
5.0000 mg | ORAL_TABLET | ORAL | Status: DC | PRN
  Administered 2020-01-15 – 2020-01-30 (×17): 5 mg via ORAL
  Filled 2020-01-15 (×17): qty 1

## 2020-01-15 MED ORDER — ALPRAZOLAM 0.5 MG PO TABS
0.5000 mg | ORAL_TABLET | Freq: Four times a day (QID) | ORAL | Status: DC
  Administered 2020-01-15 – 2020-02-06 (×53): 0.5 mg via ORAL
  Filled 2020-01-15 (×43): qty 1
  Filled 2020-01-15: qty 2
  Filled 2020-01-15 (×12): qty 1

## 2020-01-15 NOTE — ED Notes (Signed)
Pt with equal chest rise and fall.

## 2020-01-15 NOTE — ED Notes (Signed)
Pt family at bedside. Pt getting up out of bed and saying he has to use the bathroom. Pt stood (with assisstance) at end of bed and urinated in brief. New brief put on, pt back to bed.

## 2020-01-15 NOTE — ED Notes (Addendum)
Pt woke up started getting very agitated, trying to get off the stretcher. Difficult to redirect. Son continues to remain at bedside.

## 2020-01-15 NOTE — ED Notes (Signed)
Recliner provided for son at bedside.

## 2020-01-15 NOTE — ED Notes (Signed)
Pt resting comfortable after Haldol given, family at the bedside. Unable to get 1900 vs to avoid to agitate pt.

## 2020-01-15 NOTE — ED Notes (Signed)
Full linen changed.

## 2020-01-15 NOTE — ED Provider Notes (Signed)
TTS consultation is appreciated.  Patient meets inpatient criteria, they are currently working on appropriate placement.   Delora Fuel, MD 41/42/39 [phone]

## 2020-01-15 NOTE — ED Notes (Signed)
Pt now asleep

## 2020-01-15 NOTE — ED Notes (Signed)
Full linen changed, brief changed.

## 2020-01-15 NOTE — Progress Notes (Signed)
Pt meets inpatient criteria. Referral  Information has been sent to the following hospitals for review:  [facility]  [facility]  [facility]  [facility]  [facility]  [facility]  [facility]  [facility]     Disposition will continue to follow for inpatient psychiatric placement.    Wells Guiles, MSW, LCSW, LCAS Clinical Social Worker II Disposition CSW [phone]

## 2020-01-16 DIAGNOSIS — F0281 Dementia in other diseases classified elsewhere with behavioral disturbance: Secondary | ICD-10-CM | POA: Diagnosis not present

## 2020-01-16 DIAGNOSIS — G300 Alzheimer's disease with early onset: Secondary | ICD-10-CM | POA: Diagnosis not present

## 2020-01-16 DIAGNOSIS — F02818 Dementia in other diseases classified elsewhere, unspecified severity, with other behavioral disturbance: Secondary | ICD-10-CM

## 2020-01-16 DIAGNOSIS — G309 Alzheimer's disease, unspecified: Secondary | ICD-10-CM

## 2020-01-16 MED ORDER — QUETIAPINE FUMARATE 25 MG PO TABS
25.0000 mg | ORAL_TABLET | Freq: Every morning | ORAL | Status: DC
  Filled 2020-01-16: qty 1

## 2020-01-16 MED ORDER — QUETIAPINE FUMARATE 50 MG PO TABS
50.0000 mg | ORAL_TABLET | Freq: Two times a day (BID) | ORAL | Status: DC
  Administered 2020-01-16 – 2020-01-17 (×2): 50 mg via ORAL
  Filled 2020-01-16 (×2): qty 1

## 2020-01-16 NOTE — ED Notes (Signed)
Public affairs consultant Ordered @ [phone]

## 2020-01-16 NOTE — ED Notes (Signed)
Pt was assisted by NT and visitor to the bathroom and we put a diaper on the pt. Pt is now laying back in bed with the tv on.

## 2020-01-16 NOTE — ED Notes (Signed)
Disregard previous Departure Condition and Patient belongings; charted in [facility]

## 2020-01-16 NOTE — ED Notes (Signed)
Lunch tray was given 

## 2020-01-16 NOTE — ED Notes (Addendum)
Pt wanted to get up out of bed and walk in the hallway. This RN and pt family assisted pt with walking in hallway. Pt takes small steps and needs assistance walking. Pt is able to tell me his name and year that he was born. This RN assisted pt back into bed, pt given sip of water and some applesauce.

## 2020-01-16 NOTE — ED Notes (Signed)
This RN and pt family helped ambulate pt down hallway and to the restroom. Pt urinated. This RN and pt family helped pt back to bed.

## 2020-01-16 NOTE — Progress Notes (Addendum)
CSW left voice message with pt's wife Adynn Caseres to update her on pt's disposition and requested a return phone call.    Wells Guiles, MSW, LCSW, LCAS Clinical Social Worker II Disposition CSW [phone]   UPDATE: CSW received phone call from pt's wife. She was updated on pt's disposition (continues to meet inpatient, no geriatric psychiatric hospitals in [place] have beds available today), and that disposition will continue to look for inpatient psychiatric placement. Mrs. Luecke has spoken with her children and have decided that, due to severity of his symptoms of dementia, she is no longer able to safely care for pt in their home. CSW informed ED CSW.

## 2020-01-16 NOTE — Progress Notes (Signed)

## 2020-01-16 NOTE — ED Provider Notes (Signed)
Emergency Medicine Observation Re-evaluation Note  [name] is a 74 y.o. male, seen on rounds today.  Pt initially presented to the ED for complaints of dementia with behavioral disturbance.   Pt is currently calm, alert, and appears in no acute distress.   Physical Exam  BP 126/84 (BP Location: Left Arm)   Pulse 60   Temp (!) 97 F (36.1 C) (Axillary)   Resp 17   SpO2 99%  Physical Exam General: alert, calm. Cardiac: normal rate.  Lungs: breathing comfortably. Psych: calm and alert.   ED Course / MDM  EKG:EKG Interpretation  Date/Time:  Sunday January 14 2020 17:39:06 EST Ventricular Rate:  60 PR Interval:    QRS Duration: 100 QT Interval:  460 QTC Calculation: 460 R Axis:   38 Text Interpretation: Sinus rhythm normal Confirmed by Arby Barrette [phone]) on 01/14/2020 7:20:56 PM  Clinical Course as of 01/16/20 0945  Sun Jan 14, 2020  1740 Repeat EKG with more accurate QTc, not prolonged at . [SJ]    Clinical Course User Index [SJ] Joy, Shawn C, PA-C   I have reviewed the labs performed to date as well as medications administered while in observation.  Recent changes in the last 24 hours include stabilization on meds, continued placement.  Plan  Current plan is for geropsych placement.   Pt appears more stable on meds today, is calm and alert.   Await BH reassessment and placement.      Cathren Laine, MD 01/16/20 [phone]
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
## 2020-01-16 NOTE — ED Notes (Signed)
Lunch Tray Ordered @ 1029. 

## 2020-01-16 NOTE — ED Notes (Signed)
Breakfast Ordered 

## 2020-01-17 MED ORDER — QUETIAPINE FUMARATE 50 MG PO TABS
25.0000 mg | ORAL_TABLET | Freq: Every day | ORAL | Status: DC
  Administered 2020-01-18 – 2020-02-07 (×19): 25 mg via ORAL
  Filled 2020-01-17 (×19): qty 1

## 2020-01-17 MED ORDER — QUETIAPINE FUMARATE 50 MG PO TABS
50.0000 mg | ORAL_TABLET | Freq: Every day | ORAL | Status: DC
  Administered 2020-01-18 – 2020-02-05 (×18): 50 mg via ORAL
  Filled 2020-01-17 (×19): qty 1

## 2020-01-17 NOTE — Progress Notes (Signed)
I called and spoke with RN, who reports patient is sedated and has been sleeping most of the day. Morning medications were held due to patient sedation. He has not required any PRN medication for agitation today. Will decrease Seroquel to 25 mg PO QPM and 50 mg PO QHS and attempt reassessment in the morning.

## 2020-01-17 NOTE — ED Notes (Signed)
Spoke to the pt's wife and gave an update. Pt's wife requested just to please keep her in the loop since she cannot come to visit. Will continue to monitor.

## 2020-01-17 NOTE — ED Provider Notes (Signed)
Emergency Medicine Observation Re-evaluation Note  [name] is a 74 y.o. male, seen on rounds today.  Pt initially presented to the ED for complaints of No chief complaint on file. Currently, the patient is sleeping   Physical Exam  BP (!) 144/89 (BP Location: Right Arm)   Pulse (!) 57   Temp 99.5 F (37.5 C) (Oral)   Resp 20   SpO2 100%  Physical Exam General: wdwn sleeping Cardiac: normal rate Lungs: clear Psych: sleeping  ED Course / MDM  EKG:EKG Interpretation  Date/Time:  Sunday January 14 2020 17:39:06 EST Ventricular Rate:  60 PR Interval:    QRS Duration: 100 QT Interval:  460 QTC Calculation: 460 R Axis:   38 Text Interpretation: Sinus rhythm normal Confirmed by Arby Barrette [phone]) on 01/14/2020 7:20:56 PM  Clinical Course as of 01/17/20 1348  Sun Jan 14, 2020  1740 Repeat EKG with more accurate QTc, not prolonged at . [SJ]    Clinical Course User Index [SJ] Joy, Shawn C, PA-C   I have reviewed the labs performed to date as well as medications administered while in observation.  Recent changes in the last 24 hours include.  Pt seen and medications adjusted   Plan  Current plan is for hold for psych placement  Patient is not under full IVC at this time.   Elson Areas, [place] 01/17/20 1350    Pricilla Loveless, MD 01/18/20 1409

## 2020-01-17 NOTE — ED Notes (Signed)
Lunch Tray Ordered @ 1038.  

## 2020-01-17 NOTE — ED Notes (Signed)
Dinner Tray Ordered @ 1714. 

## 2020-01-18 NOTE — ED Notes (Signed)
Called pt's wife and updated her on pt's status.

## 2020-01-18 NOTE — BH Assessment (Signed)
Reassessment Note: Pt presents sitting upright in hospital bed wearing gown. He visually attended to this Clinical research associate through monitor. He had difficulty verbalizing responses, mostly making a soft, stuttering sound. Pt was responsive and expressive with facial expressions and smiling. Inpt tx continues to be recommendation.

## 2020-01-18 NOTE — Evaluation (Signed)
Physical Therapy Evaluation Patient Details Name: [name] MRN: [id number] DOB: March 28, 1945 Today's Date: 01/18/2020   History of Present Illness  Pt is 74 yo male with hx of GERD, hyperlipidemia, HTN, and dementia.  He was brought to ED by wife due to increased agitation and aggressiveness with hallucinations.  Pt is awaiting geri psych placement.  Clinical Impression  Pt admitted with above diagnosis. Pt requiring min-mod A for transfers and ambulation.  Pt with confusion and difficutly following commands requiring tactile cues/assist to initiate all task, but he was cooperative. Pt will require skilled level of care at discharge. Pt currently with functional limitations due to the deficits listed below (see PT Problem List). Pt will benefit from skilled PT to increase their independence and safety with mobility to allow discharge to the venue listed below.       Follow Up Recommendations SNF    Equipment Recommendations  Rolling walker with 5" wheels    Recommendations for Other Services       Precautions / Restrictions Precautions Precautions: Fall      Mobility  Bed Mobility Overal bed mobility: Needs Assistance Bed Mobility: Supine to Sit;Sit to Supine     Supine to sit: Mod assist Sit to supine: Mod assist   General bed mobility comments: Mod A to initiate    Transfers Overall transfer level: Needs assistance Equipment used: 1 person hand held assist Transfers: Sit to/from Stand Sit to Stand: Min assist         General transfer comment: Min A to initiate and steady  Ambulation/Gait Ambulation/Gait assistance: Min assist;Mod assist Gait Distance (Feet): 25 Feet Assistive device: 1 person hand held assist (rail at times) Gait Pattern/deviations: Step-to pattern;Decreased stride length;Staggering right;Staggering left;Trunk flexed Gait velocity: decreased   General Gait Details: min-mod A to steady with multiple LOB; requiring tactile cues for  directions and assist to weight shift  Stairs            Wheelchair Mobility    Modified Rankin (Stroke Patients Only)       Balance Overall balance assessment: Needs assistance Sitting-balance support: No upper extremity supported;Feet supported Sitting balance-Leahy Scale: Fair     Standing balance support: Bilateral upper extremity supported;Single extremity supported Standing balance-Leahy Scale: Poor Standing balance comment: requiring min-mod a                             Pertinent Vitals/Pain Pain Assessment: No/denies pain    Home Living Family/patient expects to be discharged to:: Other (Comment)                 Additional Comments: Per chart awaiting geri psych placement    Prior Function           Comments: Pt unable to provide PLOF.  Per chart he was living at home with wife     Hand Dominance        Extremity/Trunk Assessment   Upper Extremity Assessment Upper Extremity Assessment: Difficult to assess due to impaired cognition (no major deficits)    Lower Extremity Assessment Lower Extremity Assessment: Difficult to assess due to impaired cognition (no major deficits noted)    Cervical / Trunk Assessment Cervical / Trunk Assessment: Normal  Communication   Communication: No difficulties  Cognition Arousal/Alertness: Awake/alert Behavior During Therapy: Restless Overall Cognitive Status: No family/caregiver present to determine baseline cognitive functioning Area of Impairment: Orientation;Attention;Awareness;Problem solving;Following commands;Memory;Safety/judgement  Orientation Level: Disoriented to;Person;Place;Time;Situation Current Attention Level: Focused Memory: Decreased recall of precautions;Decreased short-term memory Following Commands: Follows one step commands inconsistently Safety/Judgement: Decreased awareness of safety;Decreased awareness of deficits Awareness:  Intellectual Problem Solving: Slow processing;Decreased initiation;Difficulty sequencing;Requires verbal cues;Requires tactile cues General Comments: Required tactile assist to initiate any action      General Comments      Exercises     Assessment/Plan    PT Assessment Patient needs continued PT services  PT Problem List Decreased strength;Decreased mobility;Decreased safety awareness;Decreased range of motion;Decreased activity tolerance;Decreased cognition;Decreased balance;Decreased knowledge of use of DME;Decreased coordination       PT Treatment Interventions DME instruction;Therapeutic activities;Gait training;Therapeutic exercise;Patient/family education;Balance training;Functional mobility training;Cognitive remediation    PT Goals (Current goals can be found in the Care Plan section)  Acute Rehab PT Goals Patient Stated Goal: unable PT Goal Formulation: Patient unable to participate in goal setting Time For Goal Achievement: 01/31/20 Potential to Achieve Goals: Fair    Frequency Min 2X/week   Barriers to discharge        Co-evaluation               AM-PAC PT "6 Clicks" Mobility  Outcome Measure Help needed turning from your back to your side while in a flat bed without using bedrails?: A Lot Help needed moving from lying on your back to sitting on the side of a flat bed without using bedrails?: A Lot Help needed moving to and from a bed to a chair (including a wheelchair)?: A Lot Help needed standing up from a chair using your arms (e.g., wheelchair or bedside chair)?: A Lot Help needed to walk in hospital room?: A Lot Help needed climbing 3-5 steps with a railing? : A Lot 6 Click Score: 12    End of Session Equipment Utilized During Treatment: Gait belt Activity Tolerance: Patient tolerated treatment well Patient left: in bed;with call bell/phone within reach;with bed alarm set Nurse Communication: Mobility status PT Visit Diagnosis: Muscle weakness  (generalized) (M62.81);Unsteadiness on feet (R26.81)    Time: 1205-1232 PT Time Calculation (min) (ACUTE ONLY): 27 min   Charges:   PT Evaluation $PT Eval Low Complexity: 1 Low PT Treatments $Gait Training: 8-22 mins        Abran Richard, PT Acute Rehab Services Pager [phone] [facility] [place] 01/18/2020, 12:43 PM

## 2020-01-18 NOTE — ED Notes (Signed)
I cut up pt's breakfast and warmed it up and put tray in front of pt to eat.

## 2020-01-18 NOTE — Progress Notes (Signed)

## 2020-01-18 NOTE — Progress Notes (Signed)
CSW spoke with pt's wife, Dandrea Widdowson [phone]) and updated her on pt's disposition (continued inpatient). CSW explained that pt has been declined at several geriatric hospitals as his primary concern appears to be more related to dementia. [facility] are at capacity and [facility] admissions report the same.   She shared that she was unable to see pt yesterday, which was difficult. She has been with pt "24/7" for over 20 years and has been his primary caregiver for the last 7 years. Validation and supportive counseling provided.    Wells Guiles, MSW, LCSW, LCAS Clinical Social Worker II Disposition CSW [phone]   UPDATE: 1150am: Received phone call from [facility] in admissions at [facility]. She has requested nursing notes from the past 24 hours and asked about pt's ability to ambulate and perform ADL's. They do not have beds available today that require 1 to 1 care. CSW spoke pt's RN who stated that pt can eat if food is cut up for him but was unsure about ambulation. She will request a PT consult.

## 2020-01-18 NOTE — ED Provider Notes (Signed)
Emergency Medicine Observation Re-evaluation Note  [name] is a 74 y.o. male, seen on rounds today.  Pt initially presented to the ED for complaints of No chief complaint on file. Currently, the patient is moving around in the bed trying to cover up.  He does report he is cold and was given assistance on covering up and placing socks.  Physical Exam  BP (!) 146/93 (BP Location: Left Arm)   Pulse 72   Temp 97.9 F (36.6 C) (Axillary)   Resp 16   SpO2 98%  Physical Exam General: NAD Cardiac: regular rate Lungs: clear and no acute resp distress Psych: calm and cooperative but demented and confused  ED Course / MDM  EKG:EKG Interpretation  Date/Time:  Sunday January 14 2020 17:39:06 EST Ventricular Rate:  60 PR Interval:    QRS Duration: 100 QT Interval:  460 QTC Calculation: 460 R Axis:   38 Text Interpretation: Sinus rhythm normal Confirmed by Arby Barrette [phone]) on 01/14/2020 7:20:56 PM  Clinical Course as of 01/18/20 0852  Sun Jan 14, 2020  1740 Repeat EKG with more accurate QTc, not prolonged at . [SJ]    Clinical Course User Index [SJ] Joy, Shawn C, PA-C   I have reviewed the labs performed to date as well as medications administered while in observation.  Recent changes in the last 24 hours include pt required IM meds yesterday due to agitation.  Plan  Current plan is for pt to have TTS today.  Inpt geripsych.  Seroquel was decreased yesterday to hopefully prevent as much day time sedation.    Gwyneth Sprout, MD 01/18/20 [phone]

## 2020-01-19 NOTE — ED Notes (Signed)
Dinner Tray Ordered @ 1704. 

## 2020-01-19 NOTE — ED Notes (Signed)
Lunch tray ordered 

## 2020-01-20 NOTE — Progress Notes (Signed)
Inpatient treatment continues to be recommended.   Pt referred to two additional facilities:  [facility]   Disposition CSW will continue to assist with bed placement.     Jacinta Shoe, LCSW Clinical Social Worker - Disposition [phone]

## 2020-01-20 NOTE — ED Provider Notes (Signed)
Emergency Medicine Observation Re-evaluation Note  [name] is a 75 y.o. male, seen on rounds today.  Pt initially presented to the ED for complaints of behavioral symptoms, dementia, and behavioral health evaluation. Pt currently calm and alert.   Physical Exam  BP 129/88 (BP Location: Right Arm)   Pulse 81   Temp 98.2 F (36.8 C) (Oral)   Resp 20   SpO2 98%  Physical Exam General: alert, content. Cardiac: regular rate Lungs: breathing comfortably Psych: alert, content, not agitated or aggressive.   ED Course / MDM  EKG:EKG Interpretation  Date/Time:  Sunday January 14 2020 17:39:06 EST Ventricular Rate:  60 PR Interval:    QRS Duration: 100 QT Interval:  460 QTC Calculation: 460 R Axis:   38 Text Interpretation: Sinus rhythm normal Confirmed by Arby Barrette [phone]) on 01/14/2020 7:20:56 PM  Clinical Course as of 01/20/20 1216  Sun Jan 14, 2020  1740 Repeat EKG with more accurate QTc, not prolonged at . [SJ]    Clinical Course User Index [SJ] Joy, Shawn C, PA-C   I have reviewed the labs performed to date as well as medications administered while in observation.  Recent changes in the last 24 hours include BH reassessment, med management, and continued attempt at placement.   Plan  Current plan is for [facility] reassessment, med management/stabilization, and placement.      Cathren Laine, MD 01/20/20 1217

## 2020-01-20 NOTE — ED Notes (Signed)
Pt's wife, [name], given update via phone.

## 2020-01-20 NOTE — ED Notes (Signed)
Pt's brief, gown and top linen changed without issue.

## 2020-01-20 NOTE — BH Assessment (Signed)
REASSESSMENT NOTE: Pt presents propped up in hospital bed. He had difficulty attending to this writer through the monitor as well as he did for 01/18/20 reassessment. Pt made no audible response to greeting or questions. He gazed briefly at monitor. Pt maintained an open jaw/mouth throughout assessment. Inpt psychiatric tx continues to be recommended.

## 2020-01-21 NOTE — ED Notes (Signed)
Pt continues to try to get out of bed. Taking off his gown and diaper. Pt redirected multiple times. Pt does not understand. Bed alarm in place. No sitter available. This RN has 4 other full care pts and cannot watch this pt constantly. Staffing and charge RN aware of this pt. Staffing states that this pt is "only a safety case" and is not a priority.

## 2020-01-21 NOTE — ED Notes (Signed)
Pt continues to climb out of bed. Returned pt to bed before he falls out of bed. Redirected pt. Pt does not understand. Medications may be starting to work.

## 2020-01-21 NOTE — ED Provider Notes (Signed)
Emergency Medicine Observation Re-evaluation Note  [name] is a 75 y.o. male, seen on rounds today.  Pt initially presented to the ED for complaints of dementia, behavioral health evaluation.  At 1119 nurse notify that pt was being helped sitting up in bed but he fell back striking the back of his head on pillow and appears dazed for a short amount of time.  Please note patient did receive 5 mg of Haldol and 0.5 mg of Xanax approximately 20 minutes prior to this episode. Currently, the patient is sitting up in bed however appears drowsy but in no acute discomfort..  Physical Exam  BP 139/79   Pulse 76   Temp 97.9 F (36.6 C) (Oral)   Resp 16   SpO2 97%  Physical Exam General: Appears drowsy Cardiac: Heart is with regular rate and rhythm Lungs: Clear to auscultation bilaterally Psych: Drowsy  ED Course / MDM  EKG:EKG Interpretation  Date/Time:  Sunday January 14 2020 17:39:06 EST Ventricular Rate:  60 PR Interval:    QRS Duration: 100 QT Interval:  460 QTC Calculation: 460 R Axis:   38 Text Interpretation: Sinus rhythm normal Confirmed by Arby Barrette [phone]) on 01/14/2020 7:20:56 PM  Clinical Course as of 01/21/20 1118  Sun Jan 14, 2020  1740 Repeat EKG with more accurate QTc, not prolonged at . [SJ]    Clinical Course User Index [SJ] Joy, Shawn C, PA-C   I have reviewed the labs performed to date as well as medications administered while in observation.  Recent changes in the last 24 hours include scheduled medication, normal head exam, no scalp injury.  Plan  Current plan is for [facility] reassessment, medical management and placement. Patient is not under full IVC at this time.   Fayrene Helper, PA-C 01/21/20 1124    Cathren Laine, MD 01/21/20 2032

## 2020-01-21 NOTE — ED Notes (Signed)
Dinner tray provided, sitter assisting pt with eating.

## 2020-01-21 NOTE — ED Notes (Signed)
Pt continues to try to climb out of bed. This RN cannot continue to keep charting each time I have to go into pts room to return him to bed, as I have too many other things to do. Pt redressed and diaper placed back on pt.

## 2020-01-21 NOTE — ED Notes (Addendum)
Pt continues to try to climb out of bed. Pt removing his gown and diaper, throwing legs over bed rails. Pt redirected. Pt confused and does not understand. Staffing called and they do not have a sitter for this pt. Charge RN aware and no one here in the ED available to help either. Bed alarm on bed and pt redirected. Pt is in sight of this RN.

## 2020-01-21 NOTE — ED Notes (Signed)
Dinner tray ordered.

## 2020-01-21 NOTE — ED Notes (Signed)
Lunch tray ordered 

## 2020-01-21 NOTE — ED Notes (Addendum)
Pt continues to try to climb out of bed. Pt is a high safety risk. No sitter available. Charge RN aware. If this pt falls, he will be extremely hurt.

## 2020-01-21 NOTE — ED Notes (Signed)
Pt remains restless and confused, pulling at gown and sheets. Sitter present. Will continue to monitor.

## 2020-01-21 NOTE — ED Notes (Addendum)
This nurse reached out to staffing office for nursing sitter per MD order. Informed no one to send at this time,  But they are aware of the need. Will continue to monitor.

## 2020-01-21 NOTE — ED Notes (Signed)
Sitter states that there will be no one to replace her. Order for sitter in due to pt being a high fall risk. Pt trying to get out of bed at this time.

## 2020-01-21 NOTE — ED Notes (Signed)
Pt still awake, and moving around in bed, playing with blankets and gown.

## 2020-01-21 NOTE — ED Notes (Signed)
Breakfast Ordered 

## 2020-01-21 NOTE — ED Notes (Addendum)
Pt continues to climb out of bed. Pt is a high fall risk. Bed alarm in place. Staffing called again to get update on sitter situation. Still no sitter available. Charge RN aware. Brief changed.

## 2020-01-21 NOTE — ED Notes (Signed)
Pt attempting to crawl out of bed. Redirected pt and re oriented pt to surroundings. Pt confused and still attempting to get out of bed and remove clothing and diaper.

## 2020-01-21 NOTE — ED Notes (Signed)
Pt confused, attempting to get out of bed, not able to follow simple commands, pulling on nursing staff trying to assist him back in bed.

## 2020-01-22 NOTE — ED Notes (Signed)
Pt resting normal rise and fall of chest

## 2020-01-22 NOTE — Care Management (Signed)
Care Management   Per Dorene Grebe, at [facility] -      no open beds.

## 2020-01-22 NOTE — ED Notes (Signed)
Pt paced in mittens but no restraints

## 2020-01-22 NOTE — ED Notes (Signed)
Pt did not each much diner. Sitter stated that the patient has no trouble feeding himself or chewing/swallowing. Offering [place] sandwich  cut apart  As finger foods in attempt to get Pt to eat more than he has been eating.

## 2020-01-22 NOTE — BHH Counselor (Signed)
TTS reassessment: Patient presents sitting upright in bed. He is oriented to self but is unable to state his location, date of birth, or date.  He does engage with this counselor via tele-psych routine, however, he largely mumbles incoherently. Patient nods his head yes when asked if he his son could be contacted.  Patient continues to meet geri-psych criteria.

## 2020-01-22 NOTE — Progress Notes (Signed)
[facility] ([facility]) Community Based Palliative Care       This patient is enrolled in our palliative care services in the community.  [facility] will continue to follow for any discharge planning needs and to coordinate continuation of palliative care.   If you have questions or need assistance, please call [phone] or contact the hospital Liaison listed on AMION.     Thank you for the opportunity to participate in this patient's care.     Chrislyn King, BSN, RN [facility] Hospital Liaison   [phone]   

## 2020-01-22 NOTE — ED Notes (Signed)
Pt attempting to climb out of bed. Pt redirected.

## 2020-01-22 NOTE — Progress Notes (Signed)
Pt continues to meet inpatient criteria. Referral  Information has been sent to the following hospitals for review:  [facility]  [facility]  [facility]  [facility]  [facility]  [facility]  [facility]     Disposition will continue to follow for inpatient psychiatric placement.   Ladoris Gene MSW,LCSWA,LCASA Clinical Social Worker  [place] Disposition [phone] (cell)

## 2020-01-22 NOTE — ED Notes (Signed)
Lunch Tray Ordered @ 1045 

## 2020-01-23 LAB — POC SARS CORONAVIRUS 2 AG -  ED: SARS Coronavirus 2 Ag: NEGATIVE

## 2020-01-23 NOTE — Progress Notes (Signed)
Physical Therapy Treatment Patient Details Name: [name] MRN: [id number] DOB: 09/01/1945 Today's Date: 01/23/2020    History of Present Illness Pt is 75 yo male with hx of GERD, hyperlipidemia, HTN, and dementia.  He was brought to ED by wife due to increased agitation and aggressiveness with hallucinations.  Pt is awaiting geri psych placement.    PT Comments    Pt progressing towards goals. Continues to have difficulty following commands and requires mod-max tactile cues. Requiring mod A to perform transfers this session. Was unable to sequence to ambulate. Max tactile cues for performing HEP. Current recommendations appropriate. Will continue to follow acutely.    Follow Up Recommendations  SNF;Supervision/Assistance - 24 hour     Equipment Recommendations  Wheelchair (measurements PT);Wheelchair cushion (measurements PT);Rolling walker with 5" wheels    Recommendations for Other Services       Precautions / Restrictions Precautions Precautions: Fall Restrictions Weight Bearing Restrictions: No    Mobility  Bed Mobility Overal bed mobility: Needs Assistance Bed Mobility: Sit to Supine;Supine to Sit     Supine to sit: Mod assist Sit to supine: Mod assist   General bed mobility comments: Mod tactile cues for initiation of tasks.  Transfers Overall transfer level: Needs assistance Equipment used: 1 person hand held assist Transfers: Sit to/from Stand Sit to Stand: Mod assist         General transfer comment: Performed standing X5. Pt with flexed posture and knees bent. Fatiguing easily and sitting abruptly.  Ambulation/Gait                 Stairs             Wheelchair Mobility    Modified Rankin (Stroke Patients Only)       Balance Overall balance assessment: Needs assistance Sitting-balance support: No upper extremity supported;Feet supported Sitting balance-Leahy Scale: Fair     Standing balance support: Bilateral upper  extremity supported;Single extremity supported Standing balance-Leahy Scale: Poor Standing balance comment: Reliant on external support                            Cognition Arousal/Alertness: Awake/alert Behavior During Therapy: Restless Overall Cognitive Status: No family/caregiver present to determine baseline cognitive functioning                                 General Comments: Required tactile cues for all mobility tasks. Difficulty sequencing noted and unable to follow commands. Pt unable to problem solve to put sock on.      Exercises General Exercises - Lower Extremity Heel Slides: AAROM;Both;10 reps Hip ABduction/ADduction: AAROM;Both;10 reps Straight Leg Raises: AAROM;Both;10 reps    General Comments General comments (skin integrity, edema, etc.): Required max tactile cues to perform HEP.      Pertinent Vitals/Pain Pain Assessment: No/denies pain    Home Living                      Prior Function            PT Goals (current goals can now be found in the care plan section) Acute Rehab PT Goals Patient Stated Goal: unable PT Goal Formulation: Patient unable to participate in goal setting Time For Goal Achievement: 01/31/20 Potential to Achieve Goals: Fair Progress towards PT goals: Progressing toward goals    Frequency    Min 2X/week  PT Plan Equipment recommendations need to be updated    Co-evaluation              AM-PAC PT "6 Clicks" Mobility   Outcome Measure  Help needed turning from your back to your side while in a flat bed without using bedrails?: A Lot Help needed moving from lying on your back to sitting on the side of a flat bed without using bedrails?: A Lot Help needed moving to and from a bed to a chair (including a wheelchair)?: A Lot Help needed standing up from a chair using your arms (e.g., wheelchair or bedside chair)?: A Lot Help needed to walk in hospital room?: A Lot Help needed  climbing 3-5 steps with a railing? : Total 6 Click Score: 11    End of Session Equipment Utilized During Treatment: Gait belt Activity Tolerance: Patient tolerated treatment well Patient left: in bed;with call bell/phone within reach (on bed in ED) Nurse Communication: Mobility status PT Visit Diagnosis: Muscle weakness (generalized) (M62.81);Unsteadiness on feet (R26.81)     Time: 3748-2707 PT Time Calculation (min) (ACUTE ONLY): 15 min  Charges:  $Therapeutic Activity: 8-22 mins                     Cindee Salt, DPT  Acute Rehabilitation Services  Pager: [phone] Office: [phone]    Lehman Prom 01/23/2020, 4:55 PM

## 2020-01-23 NOTE — BH Assessment (Signed)
Reassessment Note:  Pt presents lying on his back in hospital bed. He is unable to attend to my voice or to the screen. Pt has his right arm in the air seemingly picking things out of the air. At this time pt continues to meet inpt tx criteria.

## 2020-01-23 NOTE — ED Provider Notes (Signed)
Emergency Medicine Observation Re-evaluation Note  [name] is a 75 y.o. male, seen on rounds today.  Pt initially presented to the ED for complaints of Dementia Currently, the patient is eating lunch, technicians feeding him.  He is calm.  Physical Exam  BP (!) 152/91 (BP Location: Right Arm)   Pulse 63   Temp 97.9 F (36.6 C) (Oral)   Resp 16   SpO2 96%  Physical Exam General: Elderly appearing alert and responsive Cardiac: Normal heart rate Lungs: No respiratory distress Psych: Responding to internal stimuli, pointing at various things in front of him while counting.  ED Course / MDM  EKG:EKG Interpretation  Date/Time:  Sunday January 14 2020 17:39:06 EST Ventricular Rate:  60 PR Interval:    QRS Duration: 100 QT Interval:  460 QTC Calculation: 460 R Axis:   38 Text Interpretation: Sinus rhythm normal Confirmed by Arby Barrette [phone]) on 01/14/2020 7:20:56 PM  Clinical Course as of 01/23/20 1246  Sun Jan 14, 2020  1740 Repeat EKG with more accurate QTc, not prolonged at . [SJ]    Clinical Course User Index [SJ] Joy, Shawn C, PA-C   I have reviewed the labs performed to date as well as medications administered while in observation.  Recent changes in the last 24 hours include he remains cooperative with efforts to assist him.  Plan  Current plan is for psychiatric placement. Patient is under full IVC at this time.   Mancel Bale, MD 01/23/20 1249

## 2020-01-23 NOTE — ED Notes (Signed)
Patient gown changed and linens changed.

## 2020-01-23 NOTE — ED Notes (Signed)
Dinner Tray Ordered @ 1713. 

## 2020-01-23 NOTE — ED Notes (Signed)
Breakfast Ordered 

## 2020-01-23 NOTE — ED Notes (Signed)
Spoke with pt's wife and updated her on pt.

## 2020-01-24 NOTE — ED Notes (Signed)
Pt provided hygiene care, fresh linens and repositioned to most comfortable position.

## 2020-01-24 NOTE — ED Notes (Signed)
Patient uncooperative while attempting to measure vital signs. Will attempt again when breakfast trays have arrived.

## 2020-01-24 NOTE — ED Notes (Signed)
Patient becoming agitated, trying to get out of bed to reach things he says he sees in the room but are not there. Patient is easily redirectable, will continue to monitor.

## 2020-01-24 NOTE — ED Notes (Signed)
Patient frequently attempting to get out of bed, not following commands, and becoming agitated. Patient no longer easily redirectable.

## 2020-01-24 NOTE — ED Notes (Signed)
Unable to obtain vitals at this time due to pt constant movement during vital attempt. Pt pleasantly demented.

## 2020-01-25 DIAGNOSIS — F0281 Dementia in other diseases classified elsewhere with behavioral disturbance: Secondary | ICD-10-CM | POA: Diagnosis not present

## 2020-01-25 DIAGNOSIS — G300 Alzheimer's disease with early onset: Secondary | ICD-10-CM | POA: Diagnosis not present

## 2020-01-25 NOTE — Progress Notes (Signed)
[facility] ([facility]) Community Based Palliative Care  This patient is enrolled in our palliative care services in the community.   [facility] will continue to follow for any discharge planning needs and to coordinate continuation of palliative care.  If you have questions or need assistance, please call [phone] or contact the hospital Liaison listed on AMION.  Thank you for the opportunity to participate in this patient's care.  Sherry Gibson, RN [facility] Hospital Liaison [phone] 

## 2020-01-25 NOTE — Progress Notes (Signed)
CSW faxed referrals to several area SNFs  

## 2020-01-25 NOTE — Progress Notes (Signed)
CSW spoke with pt's wife Dewayne Hatch. She wanted to make sure that nursing staff was aware that pt has a DNR form. She also stated that she was able to see pt and that he "looked very good".   [facility] RN notified of advanced directive.    Wells Guiles, MSW, LCSW, LCAS Clinical Social Worker II Disposition CSW [phone]

## 2020-01-25 NOTE — ED Notes (Signed)
Meal tray delivered to patient.  

## 2020-01-25 NOTE — Progress Notes (Signed)
CSW met with Pt at bedside. Pt is pleasant, but not oriented. CSW contacted wife, [name] [phone] to gather collateral information as well as to explain SNF placement process. [name] states that she is Pt's PoA.    01/25/20 1832  TOC Assessment  Expected Discharge Plan [place]  Final next level of care Skilled Nursing Facility  Barriers to Discharge Continued Medical Work up  Patient states their goals for this hospitalization and ongoing recovery are: Per wife-to regain strength  CMS Medicare.gov Compare Post Acute Care list provided to: Patient Represenative (must comment) [name], [name] Spouse [phone])  Choice offered to / list presented to  Spouse  Living arrangements for the past 2 months Single Family Home  Lives with: Spouse  Permission sought to share information with  Facility Sport and exercise psychologist  Permission granted to share information with  Yes, Verbal Permission Granted [name], [name] Spouse [phone] [facility]))  Permission granted to share info w Relationship [name], [name] [phone]  Permission granted to share info w [place] Spouse [phone]  Patient language and need for interpreter reviewed: No (not needed)  Criminal Activity/Legal Involvement Pertinent to Current Situation/Hospitalization No - Comment as needed  Need for Family Participation in Patient [place] giver support system in place? [name], [name] Spouse [phone])  Appearance: Appears stated age  Attitude/Demeanor/Rapport Inconsistent  Affect (typically observed) Pleasant  Orientation:  Fluctuating Orientation (Suspected and/or reported Sundowners)  Alcohol / Substance Use Not Applicable  Psych Involvement Y (Pt psych cleared on 01/25/20)

## 2020-01-25 NOTE — ED Notes (Signed)
Pt being fed by nursing staff, pt requires total care for ADL's.

## 2020-01-25 NOTE — ED Notes (Signed)
Lunch Tray Ordered @ 1114.  

## 2020-01-25 NOTE — ED Provider Notes (Signed)
Emergency Medicine Observation Re-evaluation Note  [name] is a 75 y.o. male, seen on rounds today.  Pt initially presented to the ED for complaints of Dementia Currently, the patient is for geri-psych placement with TTS.  Physical Exam  BP (!) 142/89 (BP Location: Right Arm)   Pulse 67   Temp (!) 97.5 F (36.4 C) (Axillary)   Resp 16   SpO2 98%  Physical Exam General: Agitated at times.  Cardiac: Well perfused.  Lungs: Even, unlabored respirations.  Psych: Agitated and times and difficult to re-direct.   ED Course / MDM  EKG:EKG Interpretation  Date/Time:  Sunday January 14 2020 17:39:06 EST Ventricular Rate:  60 PR Interval:    QRS Duration: 100 QT Interval:  460 QTC Calculation: 460 R Axis:   38 Text Interpretation: Sinus rhythm normal Confirmed by Arby Barrette [phone]) on 01/14/2020 7:20:56 PM  Clinical Course as of 01/25/20 0942  Sun Jan 14, 2020  1740 Repeat EKG with more accurate QTc, not prolonged at . [SJ]    Clinical Course User Index [SJ] Joy, Shawn C, PA-C   I have reviewed the labs performed to date as well as medications administered while in observation.  Recent changes in the last 24 hours include patient intermittently agitated and difficult to re-direct.  Plan  Current plan is for geri-psych placement.    Maia Plan, MD 01/26/20 [phone]

## 2020-01-25 NOTE — Consult Note (Signed)
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 Chart reviewed. [name] is a 75 year old male with history of Alzheimer's dementia who presented to [facility] via EMS for agitation, aggressive behaviors, and hallucinations.   On assessment today, patient remains minimally engaged with telepsych machine. Nursing reports he has shown decline in ADLs and strength since being in the ED. He is calm, showing no aggressive behaviors and denies SI/HI. Will psych clear so SNF placement can be pursued.  I called and spoke with wife [name] [phone] to update on disposition. I also let her know to bring in copy of DNR for patient's chart.  Per TTS assessment 01/14/20: [name] is a 75 year old married male who presents to [facility] ED accompanied by his Houston, [name] [phone], who participated in assessment. [name]'s Houston reports [name] was diagnosed with early onset Alzheimer's dementia 5-6 years ago. [name] was unable to participate in assessment, as he did not appear to acknowledge the tele-cart and said very little during assessment. [name]'s Houston states November 20, 2019 [name] had a fever of 102 degrees and was evaluated for a UTI, which was negative. He says since then [name] has been increasingly agitated, aggressive, and confused. Over the past week, [name] has been aggressive and threatening towards his wife and other people. He is having visual hallucinations of people in the house and blood on the countertops. He has been paranoid and believes people are in the house. He has been constantly walking about trying to straighten objects. [name]  reports [name] has been hitting himself and banging his head on surfaces. During assessment, [name] continually attempted to get out of bed, pull out his IV, and appeared disoriented. [name]'s Houston repeatedly tried to redirect [name] but [name] would not stay still. [name]'s Houston states [name] has no history of mental health treatment.   [name] is dressed in hospital gown, alert and does not appear oriented to time, place or situation. [name] speaks in a mumbled tone, at moderate volume and normal pace. Motor behavior appears restless. Eye contact is minimal.   Disposition: Patient shows no evidence of acute risk of harm to self or others and is psych cleared for discharge. Will place [facility] consult for SNF placement for dementia. ED staff updated.  Past Psychiatric History: See above  Risk to Self:   Risk to Others:   Prior Inpatient Therapy:   Prior Outpatient Therapy:    Past Medical History:  Past Medical History:  Diagnosis Date  . Arthritis   . CAD (coronary artery disease) 09/18/2010   Myoview 8/20: EF 62, mild apical thinning, no ischemia, low risk study  . Chest pain   . Colon polyp   . Coronary artery disease    post PTCA and stent of the distal RCA. He has a 3.0 mm espress 2 stent deployed at 14 atmospheres placed in [place], [place]            . Dyslipidemia   . Erectile dysfunction   . GERD (gastroesophageal reflux disease)   . Headache   . Hyperlipidemia   . Hypertension   . Kidney stones    history   . Memory  loss   . Skin cancer 2015    Past Surgical History:  Procedure Laterality Date  . BACK SURGERY    . CAROTID ENDARTERECTOMY    . COLONOSCOPY    . CORONARY ANGIOPLASTY WITH STENT PLACEMENT     post PTCA and stent of the distal RCA. He has a 3.0 mm espress 2 stent deployed at 14 atmospheres placed in [place], [place]  06/2014  . EYE SURGERY    . HERNIA REPAIR    . JOINT REPLACEMENT     left knee  . POLYPECTOMY    . TOTAL KNEE ARTHROPLASTY     left    Family History:  Family History  Problem Relation Age of Onset  . Aortic aneurysm Father   . Cancer Father        colon   . Coronary artery disease Mother        with CABG  . Alzheimer's disease Mother   . Hypertension Brother    Family Psychiatric  History: Unknown Social History:  Social History   Substance and Sexual Activity  Alcohol Use Yes   Comment: 0.6 oz per week      Social History   Substance and Sexual Activity  Drug Use No    Social History   Socioeconomic History  . Marital status: Married    Spouse name: Not on file  . Number of children: Not on file  . Years of education: Not on file  . Highest education level: Not on file  Occupational History  . Not on file  Tobacco Use  . Smoking status: Never Smoker  . Smokeless tobacco: Former Systems developer    Types: Secondary school teacher  . Vaping Use: Never used  Substance and Sexual Activity  . Alcohol use: Yes    Comment: 0.6 oz per week   . Drug use: No  . Sexual activity: Not on file  Other Topics Concern  . Not on file  Social History Narrative   Right handed      Highest level of edu- 12 grade      Lives with wife in single level home   Social Determinants of Health   Financial Resource Strain: Not on file  Food Insecurity: Not on file  Transportation Needs: Not on file  Physical Activity: Not on file  Stress: Not on file  Social Connections: Not on file   Additional Social History:    Allergies:   Allergies  Allergen Reactions  . Oxycodone-Acetaminophen Other (See Comments)    [name] saw bugs    Labs: No results found for this or any previous visit (from the past 48 hour(s)).  Medications:  Current Facility-Administered Medications  Medication Dose Route Frequency Provider Last Rate Last Admin  . ALPRAZolam Duanne Moron) tablet 0.5 mg  0.5 mg Oral Q6H Horton, Kristie M, DO   0.5 mg at 01/25/20 1424  . haloperidol (HALDOL) tablet 5 mg  5 mg Oral [id number] PRN Delora Fuel, MD   5 mg at 01/24/20 1057   . QUEtiapine (SEROQUEL) tablet 25 mg  25 mg Oral Daily Connye Burkitt, NP   25 mg at 01/25/20 1305  . QUEtiapine (SEROQUEL) tablet 50 mg  50 mg Oral QHS Connye Burkitt, NP   50 mg at 01/24/20 2300   Current Outpatient Medications  Medication Sig Dispense Refill  . acetaminophen (TYLENOL) 500 MG tablet Take 500 mg  by mouth daily as needed for headache (pain).    [name] ALPRAZolam (XANAX) 0.5 MG tablet Take 0.5 mg by mouth See admin instructions. Take one tablet (0.5 mg) by mouth every 5-6 hours while awake    . nitroGLYCERIN (NITROSTAT) 0.4 MG SL tablet Place 1 tablet (0.4 mg total) under the tongue every 5 (five) minutes as needed for chest pain. 25 tablet 6  . QUEtiapine (SEROQUEL) 25 MG tablet Take 25 mg by mouth 2 (two) times daily.     Psychiatric Specialty Exam: Physical Exam  Review of Systems  Blood pressure (!) 137/91, pulse 84, temperature (!) 97 F (36.1 C), temperature source Axillary, resp. rate 18, SpO2 97 %.There is no height or weight on file to calculate BMI.  General Appearance: Disheveled  Eye Contact:  Poor  Speech:  Slow  Volume:  Decreased  Mood:  Euthymic  Affect:  Flat  Thought Process:  UTA- mostly nonverbal  Orientation:  UTA- mostly nonverbal  Thought Content:  UTA- mostly nonverbal  Suicidal Thoughts:  No  Homicidal Thoughts:  No  Memory:  Immediate;   Poor Recent;   Poor Remote;   Poor  Judgement:  Poor  Insight:  Lacking  Psychomotor Activity:  Normal  Concentration:  Concentration: Poor and Attention Span: Poor  Recall:  Poor  Fund of Knowledge:  Fair  Language:  Fair  Akathisia:  No  Handed:  Right  AIMS (if indicated):     Assets:  Leisure Time Resilience Social Support  ADL's:  Impaired  Cognition:  Impaired,  Severe  Sleep:       Disposition: Patient shows no evidence of acute risk of harm to self or others and is psych cleared for discharge. Will place [facility] consult for SNF placement for dementia. ED staff updated.  This service was  provided via telemedicine using a 2-way, interactive audio and video technology with the identified patient and this Probation officer.  Connye Burkitt, NP 01/25/2020 3:56 PM

## 2020-01-25 NOTE — ED Notes (Signed)
Pt adjusted in bed, staff helping pt to eat.

## 2020-01-25 NOTE — NC FL2 (Signed)
[place] LEVEL OF CARE SCREENING TOOL     IDENTIFICATION  Patient Name: [name] Birthdate: July 28, 1945 Sex: male Admission Date (Current Location): 01/14/2020  [facility] and [place] Number:  Herbalist and Address:  [facility] [place], [place] [address], [place], [place] [address]      Provider Number: [id number]  Attending Physician Name and Address:  Default, Provider, MD  Relative Name and Phone Number:  Teshawn, Soucie [phone]    Current Level of Care: Hospital Recommended Level of Care: [place] Prior Approval Number:    Date Approved/Denied:   PASRR Number: [id number]  Discharge Plan: SNF    Current Diagnoses: Patient Active Problem List   Diagnosis Date Noted  . 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 Syncope 08/11/2018  . Memory loss   . Pyrexia   . Chest pain 05/19/2015  . Headache 05/19/2015  . Pulsatile abdominal mass 05/17/2012  . Hyperlipidemia 11/09/2011  . Sinus bradycardia 11/09/2011  . HTN (hypertension) 09/18/2010  . CAD (coronary artery disease) 09/18/2010    Orientation RESPIRATION BLADDER Height & Weight        Normal Incontinent Weight:   Height:     BEHAVIORAL SYMPTOMS/MOOD NEUROLOGICAL BOWEL NUTRITION STATUS  [place]   Incontinent Diet (Regular)  AMBULATORY STATUS COMMUNICATION OF NEEDS Skin   Extensive Assist Verbally Normal                       Personal Care Assistance Level of Assistance  Bathing,Feeding,Dressing Bathing Assistance: Limited assistance Feeding assistance: Limited assistance Dressing Assistance: Limited assistance     Functional Limitations Info  Sight,Hearing,Speech Sight Info: Adequate Hearing Info: Adequate Speech Info: Adequate    SPECIAL CARE FACTORS FREQUENCY  PT (By licensed PT),OT (By licensed OT)     PT Frequency: 5x weekly OT Frequency: 5x weekly            Contractures  Contractures Info: Present    Additional Factors Info  Code Status Code Status Info: DNR             Current Medications (01/25/2020):  This is the current hospital active medication list Current Facility-Administered Medications  Medication Dose Route Frequency Provider Last Rate Last Admin  . ALPRAZolam Duanne Moron) tablet 0.5 mg  0.5 mg Oral Q6H Horton, Kristie M, DO   0.5 mg at 01/25/20 1850  . haloperidol (HALDOL) tablet 5 mg  5 mg Oral [id number] PRN Delora Fuel, MD   5 mg at 01/25/20 1757  . QUEtiapine (SEROQUEL) tablet 25 mg  25 mg Oral Daily Connye Burkitt, NP   25 mg at 01/25/20 1305  . QUEtiapine (SEROQUEL) tablet 50 mg  50 mg Oral QHS Connye Burkitt, NP   50 mg at 01/24/20 2300   Current Outpatient Medications  Medication Sig Dispense Refill  . acetaminophen (TYLENOL) 500 MG tablet Take 500 mg by mouth daily as needed for headache (pain).    [name] ALPRAZolam (XANAX) 0.5 MG tablet Take 0.5 mg by mouth See admin instructions. Take one tablet (0.5 mg) by mouth every 5-6 hours while awake    . nitroGLYCERIN (NITROSTAT) 0.4 MG SL tablet Place 1 tablet (0.4 mg total) under the tongue every 5 (five) minutes as needed for chest pain. 25 tablet 6  . QUEtiapine (SEROQUEL) 25 MG tablet Take 25 mg by mouth 2 (two) times daily.       Discharge Medications: Please see  discharge summary for a list of discharge medications.  Relevant Imaging Results:  Relevant Lab Results:   Additional Information SSN# [id number]  Shella Spearing, LCSW

## 2020-01-26 NOTE — ED Notes (Signed)
Sitter at bedside assisting pt with breakfast.

## 2020-01-26 NOTE — ED Notes (Signed)
PT to bedside

## 2020-01-26 NOTE — ED Notes (Signed)
Lunch tray ordered 

## 2020-01-26 NOTE — Progress Notes (Signed)
Physical Therapy Treatment Patient Details Name: [name] MRN: [id number] DOB: [birth year] Today's Date: 01/26/2020    History of Present Illness Pt is 75 yo male with hx of GERD, hyperlipidemia, HTN, and dementia.  He was brought to ED by wife due to increased agitation and aggressiveness with hallucinations.  Pt is awaiting SNF placement    PT Comments    Pt with slow progression towards goals. More lethargic this session, however, NT and RN reporting pt receiving meds overnight to help calm him. Requiring total A to sit up at EOB and max A +2 for standing trials this session. Current recommendations appropriate. Will continue to follow acutely.    Follow Up Recommendations  SNF;Supervision/Assistance - 24 hour     Equipment Recommendations  Wheelchair (measurements PT);Wheelchair cushion (measurements PT);Rolling walker with 5" wheels    Recommendations for Other Services       Precautions / Restrictions Precautions Precautions: Fall Restrictions Weight Bearing Restrictions: No    Mobility  Bed Mobility Overal bed mobility: Needs Assistance Bed Mobility: Supine to Sit     Supine to sit: Total assist Sit to supine: Mod assist;+2 for physical assistance   General bed mobility comments: Pt requiring total A to sit at EOB secondary to lethargy. Able to assist with return to supine, but required assist for LEs.  Transfers Overall transfer level: Needs assistance Equipment used: 2 person hand held assist Transfers: Sit to/from Stand Sit to Stand: Max assist;+2 physical assistance         General transfer comment: Attempted standing X 4 with assist from tech. Able to clear hips minimally on first two attempts. More clearance on last two attempts, however, pt's knee remained flexed and posture very flexed.  Ambulation/Gait                 Stairs             Wheelchair Mobility    Modified Rankin (Stroke Patients Only)       Balance Overall  balance assessment: Needs assistance Sitting-balance support: No upper extremity supported;Feet supported Sitting balance-Leahy Scale: Fair     Standing balance support: Bilateral upper extremity supported Standing balance-Leahy Scale: Poor Standing balance comment: Reliant on external support                            Cognition Arousal/Alertness: Lethargic Behavior During Therapy: Flat affect Overall Cognitive Status: No family/caregiver present to determine baseline cognitive functioning                                 General Comments: Pt initially very lethargic. Increased alertness with sitting upright. Continues to present with slowed processing.      Exercises      General Comments        Pertinent Vitals/Pain Pain Assessment: No/denies pain    Home Living                      Prior Function            PT Goals (current goals can now be found in the care plan section) Acute Rehab PT Goals Patient Stated Goal: unable PT Goal Formulation: Patient unable to participate in goal setting Time For Goal Achievement: 01/31/20 Potential to Achieve Goals: Fair Progress towards PT goals: Progressing toward goals (slowly)    Frequency  Min 2X/week      PT Plan Current plan remains appropriate    Co-evaluation              AM-PAC PT "6 Clicks" Mobility   Outcome Measure  Help needed turning from your back to your side while in a flat bed without using bedrails?: A Lot Help needed moving from lying on your back to sitting on the side of a flat bed without using bedrails?: A Lot Help needed moving to and from a bed to a chair (including a wheelchair)?: A Lot Help needed standing up from a chair using your arms (e.g., wheelchair or bedside chair)?: A Lot Help needed to walk in hospital room?: Total Help needed climbing 3-5 steps with a railing? : Total 6 Click Score: 10    End of Session Equipment Utilized During  Treatment: Gait belt Activity Tolerance: Patient limited by lethargy Patient left: in bed;with call bell/phone within reach;with nursing/sitter in room (in bed in ED with sitter present) Nurse Communication: Mobility status PT Visit Diagnosis: Muscle weakness (generalized) (M62.81);Unsteadiness on feet (R26.81)     Time: 8657-8469 PT Time Calculation (min) (ACUTE ONLY): 11 min  Charges:  $Therapeutic Activity: 8-22 mins                     Lou Miner, DPT  Acute Rehabilitation Services  Pager: [phone] Office: [phone]    Rudean Hitt 01/26/2020, 2:29 PM

## 2020-01-27 NOTE — ED Notes (Signed)
Pt alert; breakfast at bedside; pt pulled off gown

## 2020-01-27 NOTE — ED Notes (Signed)
Pt's wife, Lelon Frohlich, updated

## 2020-01-27 NOTE — Progress Notes (Addendum)
CSW notes patient has no current bed offers. Based on recent psych eval and gero psych process and current behaviors CSW anticipates patient will be difficult to place.   2:20p: CSW expanded SNF search and spoke with a few facilities to re-review with no progress.

## 2020-01-27 NOTE — ED Notes (Signed)
Pt completely naked. Had taken off gown, diaper, blankets and chux and thrown all in the floor. Pt urinated in bed. Changed the bed and cleaned up the patient. Redressed the patient. New diaper and chux placed on pt.

## 2020-01-27 NOTE — ED Notes (Addendum)
Sitter at bedside, feeding pt lunch

## 2020-01-27 NOTE — ED Notes (Signed)
Pt pulled off gown again, pulled off covers; pulling at brief; brief soiled; pt cleaned, brief changed; linens changed; condom cath applied; new gown placed on pt

## 2020-01-27 NOTE — ED Notes (Signed)
Fed pt breakfast; pt ate approx 50% meal

## 2020-01-27 NOTE — ED Notes (Signed)
Geri psych; Breakfast Order placed

## 2020-01-27 NOTE — ED Notes (Signed)
Pt sleeping. 

## 2020-01-28 NOTE — ED Notes (Signed)
Pt diaper and chux wet with urine. Pt cleaned. Changed chux and diaper. New blankets given to patient.

## 2020-01-28 NOTE — ED Notes (Signed)
Pt only ate a few bites of his lunch. Notified nurse; pt had late breakfast. Lunch tray left at bedside. Also incontinent of urine. Brief changed. Pt clean and dry at this time.

## 2020-01-28 NOTE — ED Notes (Signed)
Condom cath pulled off.

## 2020-01-28 NOTE — ED Notes (Signed)
Patient breakfast tray placed at bedside. Patient asleep this time.

## 2020-01-28 NOTE — Progress Notes (Addendum)
CSW reviewed and at this time continues to have no bed offers.   8:00a: CSW contacted via hub. Patient under review for [facility] with anticipated response on 1/10.

## 2020-01-28 NOTE — ED Notes (Signed)
Patient resting at this time. Given afternoon medications with applesauce.

## 2020-01-28 NOTE — ED Notes (Signed)
Patient wife @ bedside. Update given @ this time. Patient wife states she is going to go home and come back to visit later on.

## 2020-01-28 NOTE — ED Notes (Signed)
This RN spoke with patients wife and gave her an update. Wife planning on coming to visit patient this afternoon.

## 2020-01-29 LAB — SARS CORONAVIRUS 2 (TAT 6-24 HRS): SARS Coronavirus 2: NEGATIVE

## 2020-01-29 NOTE — ED Notes (Addendum)
Feeding pt.  Pt able to feed himself if food is placed in his hand.  Spoke with wife and let her speak with her husband briefly.

## 2020-01-29 NOTE — Progress Notes (Signed)
Pt continues to have no bed offers.  CSW reached out to [facility] and [facility] DTP team to seek input for possible next steps in process.

## 2020-01-29 NOTE — ED Provider Notes (Signed)
Emergency Medicine Observation Re-evaluation Note  [name] is a 75 y.o. male, seen on rounds today.  Pt initially presented to the ED for complaints of Dementia Currently, the patient is resting comfortably in the ER bed  Physical Exam  BP (!) 152/93 (BP Location: Left Arm)    Pulse 70    Temp (!) 97.5 F (36.4 C) (Oral)    Resp 18    SpO2 100%  Physical Exam Vitals reviewed.  Constitutional:      Appearance: Normal appearance.  HENT:     Head: Normocephalic and atraumatic.  Eyes:     General:        Right eye: No discharge.        Left eye: No discharge.     Extraocular Movements: Extraocular movements intact.     Conjunctiva/sclera: Conjunctivae normal.  Musculoskeletal:        General: No swelling. Normal range of motion.  Neurological:     General: No focal deficit present.     Mental Status: He is alert and oriented to person, place, and time.  Psychiatric:        Mood and Affect: Mood normal.        Behavior: Behavior normal.      ED Course / MDM  EKG:EKG Interpretation  Date/Time:  Sunday January 14 2020 17:39:06 EST Ventricular Rate:  60 PR Interval:    QRS Duration: 100 QT Interval:  460 QTC Calculation: 460 R Axis:   38 Text Interpretation: Sinus rhythm normal Confirmed by Charlesetta Shanks [phone]) on 01/14/2020 7:20:56 PM  Clinical Course as of 01/29/20 0942  Sun Jan 14, 2020  1740 Repeat EKG with more accurate QTc, not prolonged at 445ms. [SJ]    Clinical Course User Index [SJ] Joy, Shawn C, PA-C   I have reviewed the labs performed to date as well as medications administered while in observation.  Recent changes in the last 24 hours include .  Plan  Current plan is for awaiting placement for Geri-psych.  Per chart review today, patient has no efforts at this time for facilities.    Garald Balding, PA-C 01/29/20 0945    Gareth Morgan, MD 01/30/20 [phone]

## 2020-01-29 NOTE — ED Notes (Signed)
Dinner Trays Ordered @ [phone].

## 2020-01-29 NOTE — ED Notes (Signed)
Breakfast Ordered 

## 2020-01-29 NOTE — ED Notes (Signed)
Pt calm, cooperative, will not make eye contact, follows commands.

## 2020-01-29 NOTE — Progress Notes (Addendum)
11:12am- CSW reached back out to [facility], with no answer still. CSW noted that in hub it appears that pt has been denied by facility, however CSW still attempting to reach contact to confirm or deny this. At this me pt still has no other bed offers.   Upon further chart review, it appears that pt has no offers at this time. CSW followed up with [facility] as note reported pt may have been under review, however no answer at this time.    Virgie Dad Aneesh Faller, MSW, LCSW [facility] [place] [facility] [place] [phone]

## 2020-01-29 NOTE — ED Notes (Signed)
Lunch Tray Ordered 1115. 

## 2020-01-30 NOTE — ED Provider Notes (Signed)
Emergency Medicine Observation Re-evaluation Note  [name] is a 75 y.o. male, seen on rounds today.  Pt initially presented to the ED for complaints of Dementia Currently, the patient is resting comfortably.  Physical Exam  BP 138/78   Pulse 80   Temp (!) 97.2 F (36.2 C) (Oral)   Resp 16   SpO2 98%  Physical Exam General: No acute distress Cardiac: Regular rhythm regular rate Lungs: Breathing easily Psych: Cooperative, resting  ED Course / MDM  EKG:EKG Interpretation  Date/Time:  Sunday January 14 2020 17:39:06 EST Ventricular Rate:  60 PR Interval:    QRS Duration: 100 QT Interval:  460 QTC Calculation: 460 R Axis:   38 Text Interpretation: Sinus rhythm normal Confirmed by Charlesetta Shanks [phone]) on 01/14/2020 7:20:56 PM  Clinical Course as of 01/30/20 0837  Sun Jan 14, 2020  1740 Repeat EKG with more accurate QTc, not prolonged at 417ms. [SJ]    Clinical Course User Index [SJ] Joy, Shawn C, PA-C   I have reviewed the labs performed to date as well as medications administered while in observation.  Recent changes in the last 24 hours include no acute changes overnight.  Plan  Current plan is for [facility] psych placement.    Dorie Rank, MD 01/30/20 [phone]

## 2020-01-30 NOTE — ED Notes (Signed)
Lunch Tray Ordered @ [id number].

## 2020-01-30 NOTE — Progress Notes (Signed)
CSW spoke with Pt's wife Lelon Frohlich via phone @ [phone] and explained that Pt is not receiving bed offers for SNF and will need to be discharged from ED. CSW offered [facility] services and DME.  Wife requested one day to get with family to discuss care management of Pt at home. CSW will follow up on 01/31/20

## 2020-01-30 NOTE — ED Notes (Signed)
Runner, broadcasting/film/video @ [phone]

## 2020-01-30 NOTE — ED Notes (Signed)
Pt ate one applesauce. Pt said a few words to this RN while feeding him. Talked about golf.

## 2020-01-30 NOTE — ED Notes (Signed)
Patient picking at air and speaking in soft whispers; Patient is alert to self only-[name],RN

## 2020-01-30 NOTE — Progress Notes (Signed)
Pt is willing to try to work, but is anxious to get back to bed.  Once back in, he is willing to be assisted with exercises, but is not putting in a solid effort today.  He does not appear to feel well, but was more comfortable with repositioning on the bed.  Did have a full morning routine before PT arrived, possibly just tired.  01/30/20 1700  PT Visit Information  Last PT Received On 01/30/20  Assistance Needed +2  History of Present Illness Pt is 75 yo male with hx of GERD, hyperlipidemia, HTN, and dementia.  He was brought to ED by wife due to increased agitation and aggressiveness with hallucinations.  Pt is awaiting SNF placement  Subjective Data  Patient Stated Goal unable  Precautions  Precautions Fall  Precaution Comments very flat, limited verbalization  Restrictions  Weight Bearing Restrictions No  Pain Assessment  Pain Assessment No/denies pain  Cognition  Arousal/Alertness Lethargic  Behavior During Therapy Flat affect  Overall Cognitive Status No family/caregiver present to determine baseline cognitive functioning  Bed Mobility  Overal bed mobility Needs Assistance  Bed Mobility Supine to Sit;Sit to Supine  Supine to sit Total assist  Sit to supine Mod assist;Max assist  General bed mobility comments total assist to sit up but can assist wtih sit balance  Transfers  Overall transfer level Needs assistance  Equipment used 1 person hand held assist  Transfers Sit to/from Stand  Sit to Stand Total assist  Ambulation/Gait  General Gait Details unable to take steps  Balance  Overall balance assessment Needs assistance  Sitting balance-Leahy Scale Fair  Standing balance support Bilateral upper extremity supported  Standing balance comment unable to come up to stand, fearful to move  General Comments  General comments (skin integrity, edema, etc.) pt is very lethargic and looking disengaged  Exercises  Exercises General Lower Extremity  General Exercises - Lower  Extremity  Ankle Circles/Pumps AAROM;5 reps  Heel Slides AAROM;10 reps  Hip ABduction/ADduction AAROM;10 reps  Straight Leg Raises AAROM;Both;10 reps  PT - End of Session  Activity Tolerance Patient limited by lethargy  Patient left in bed;with call bell/phone within reach;with nursing/sitter in room  Nurse Communication Mobility status   PT - Assessment/Plan  PT Plan Current plan remains appropriate  PT Visit Diagnosis Muscle weakness (generalized) (M62.81);Unsteadiness on feet (R26.81)  PT Frequency (ACUTE ONLY) Min 2X/week  Follow Up Recommendations SNF;Supervision/Assistance - 24 hour  PT equipment Wheelchair (measurements PT);Wheelchair cushion (measurements PT);Rolling walker with 5" wheels  AM-PAC PT "6 Clicks" Mobility Outcome Measure (Version 2)  Help needed turning from your back to your side while in a flat bed without using bedrails? 2  Help needed moving from lying on your back to sitting on the side of a flat bed without using bedrails? 2  Help needed moving to and from a bed to a chair (including a wheelchair)? 2  Help needed standing up from a chair using your arms (e.g., wheelchair or bedside chair)? 2  Help needed to walk in hospital room? 1  Help needed climbing 3-5 steps with a railing?  1  6 Click Score 10  Consider Recommendation of Discharge To: CIR/SNF/LTACH  PT Goal Progression  Progress towards PT goals Not progressing toward goals - comment  PT Time Calculation  PT Start Time (ACUTE ONLY) 1103  PT Stop Time (ACUTE ONLY) 1129  PT Time Calculation (min) (ACUTE ONLY) 26 min  PT General Charges  $$ ACUTE PT VISIT 1 Visit  PT Treatments  $Therapeutic Exercise 8-22 mins  $Therapeutic Activity 8-22 mins    Mee Hives, PT MS Acute Rehab Dept. Number: [place] and [place]

## 2020-01-30 NOTE — Progress Notes (Signed)
[facility] ([facility]) Community Based Palliative Care       This patient is enrolled in our palliative care services in the community.  [facility] will continue to follow for any discharge planning needs and to coordinate continuation of palliative care.   If you have questions or need assistance, please call [phone] or contact the hospital Liaison listed on AMION.     Thank you for the opportunity to participate in this patient's care.     Chrislyn King, BSN, RN [facility] Hospital Liaison   [phone]   

## 2020-01-30 NOTE — ED Notes (Signed)
Pt took meds when crushed in applesauce-- able to drink water from cup, but unable to drink from straw.

## 2020-01-30 NOTE — ED Notes (Signed)
Pt assisted with eating-- when given a cup, he can hold it, but needs assistance with eating with a spoon/fork-- will swallow food, then takes a drink. Does not use a straw.

## 2020-01-30 NOTE — ED Notes (Signed)
Breakfast Ordered 

## 2020-01-30 NOTE — ED Notes (Signed)
Pt diaper and chux changed. Pt has no sitter at this time. This RN watching pt closely. Pt is in this RN's sight at all times.

## 2020-01-30 NOTE — ED Notes (Signed)
Pt's diaper changed.

## 2020-01-31 NOTE — ED Notes (Signed)
Patient feed and tolerated meds well

## 2020-01-31 NOTE — Progress Notes (Signed)
CSW called [name] at [facility] to enquire about possible placement-left message requesting callback. CSW called [name] of [facility] to ask about possible bed availability and [name] states that they cannot offer bed at this time. CSW called Pt's wife [name], to update about continued efforts at placement @ [phone]. Left voicemail requesting callback.

## 2020-01-31 NOTE — ED Notes (Signed)
Pt alert, brief and linen change as well.  Pt allowed RN to feed him 1/2 of an apple sauce.  He pointed to the lights and stopped when the lights were turned off. No signs of distress at this time.

## 2020-01-31 NOTE — ED Notes (Signed)
Geri psych Breakfast ordered

## 2020-01-31 NOTE — Progress Notes (Addendum)
CSW spoke with Pt's wife, Lelon Frohlich @[phone].  Pt's wife states that she cannot care for husband at home due to her health.  CSW reitterated offer to send [facility] and social worker who may assist with Medicaid application for memory care should family decide that they wish to place Pt there. Wife asked for this CSW's supervisors name and number as she felt that CSW is cold and unfeeling for not placing Pt. That information was provided [facility] supervisor Barbette Or) Wife asked how long she had and CSW reminded wife that they had spoken yesterday 1/11 and discussed making the plan within 24 hours and stated that all possible options for placement have been exhausted and Pt has been stabilized.  Wife states that she does not remember a 24 hour deadline for making a plan being part of yesterday's conversation. Wife states that she wishes to explore her legal options in this matter. TOC supervisor updated.

## 2020-01-31 NOTE — Progress Notes (Signed)
Manufacturing engineer [facility]) Community Based Palliative Care  This patient is enrolled in our palliative care services in the community. [facility] will continue to follow for any discharge planning needs and to coordinate continuation of palliative care. If you have questions or need assistance, please call [phone] or contact the hospital Liaison listed on AMION.  Thank you for the opportunity to participate in this patient's care.  Venia Carbon RN, BSN, [facility] Liaison

## 2020-02-01 NOTE — ED Notes (Signed)
Lunch Tray Ordered @ 1022. 

## 2020-02-01 NOTE — ED Notes (Signed)
Dinner Tray Ordered @ 1733. 

## 2020-02-01 NOTE — Progress Notes (Signed)
CSW spoke with Ebony Hail of [facility]. Ebony Hail will send case to Nurse Case Reviewer for possible placement. CSW will continue to follow.

## 2020-02-01 NOTE — ED Notes (Signed)
Breakfast Ordered 

## 2020-02-01 NOTE — NC FL2 (Signed)
[place] LEVEL OF CARE SCREENING TOOL     IDENTIFICATION  Patient Name: [name] Birthdate: 1945/07/26 Sex: male Admission Date (Current Location): 01/14/2020  [facility] and [place] Number:  Herbalist and Address:  [facility] [place] [address], [place], [place] [address]      Provider Number: [id number]  Attending Physician Name and Address:  Default, Provider, MD  Relative Name and Phone Number:  [place], [place] [phone]    Current Level of Care: Hospital Recommended Level of Care: Skilled Nursing [facility] Prior Approval Number:    Date Approved/Denied:   PASRR Number: [id number]  Discharge Plan: Other (Comment) ([place])    Current Diagnoses: Patient Active Problem List   Diagnosis Date Noted  . 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 Syncope 08/11/2018  . Memory loss   . Pyrexia   . Chest pain 05/19/2015  . Headache 05/19/2015  . Pulsatile abdominal mass 05/17/2012  . Hyperlipidemia 11/09/2011  . Sinus bradycardia 11/09/2011  . HTN (hypertension) 09/18/2010  . CAD (coronary artery disease) 09/18/2010    Orientation RESPIRATION BLADDER Height & Weight     Self  Normal Continent Weight:   Height:     BEHAVIORAL SYMPTOMS/MOOD NEUROLOGICAL BOWEL NUTRITION STATUS  Wanderer   Continent Diet (Regular)  AMBULATORY STATUS COMMUNICATION OF NEEDS Skin   Extensive Assist Verbally Normal                       Personal Care Assistance Level of Assistance  Bathing,Feeding,Dressing Bathing Assistance: Maximum assistance Feeding assistance: Limited assistance Dressing Assistance: Maximum assistance     Functional Limitations Info  Sight,Hearing,Speech Sight Info: Adequate Hearing Info: Adequate Speech Info: Adequate    SPECIAL CARE FACTORS FREQUENCY  PT (By licensed PT),OT (By licensed OT)     PT Frequency: 5x weekly OT Frequency: 5x  weekly            Contractures Contractures Info: Present    Additional Factors Info  Code Status,Allergies Code Status Info: DNR Allergies Info: Oxycodone-acetaminophen           Current Medications (02/01/2020):  This is the current hospital active medication list Current Facility-Administered Medications  Medication Dose Route Frequency Provider Last Rate Last Admin  . ALPRAZolam Duanne Moron) tablet 0.5 mg  0.5 mg Oral Q6H Horton, Kristie M, DO   0.5 mg at 02/01/20 1211  . haloperidol (HALDOL) tablet 5 mg  5 mg Oral Q4O PRN Delora Fuel, MD   5 mg at 01/30/20 2101  . QUEtiapine (SEROQUEL) tablet 25 mg  25 mg Oral Daily Connye Burkitt, NP   25 mg at 02/01/20 1211  . QUEtiapine (SEROQUEL) tablet 50 mg  50 mg Oral QHS Connye Burkitt, NP   50 mg at 01/31/20 2216   Current Outpatient Medications  Medication Sig Dispense Refill  . acetaminophen (TYLENOL) 500 MG tablet Take 500 mg by mouth daily as needed for headache (pain).    [name] ALPRAZolam (XANAX) 0.5 MG tablet Take 0.5 mg by mouth See admin instructions. Take one tablet (0.5 mg) by mouth every 5-6 hours while awake    . nitroGLYCERIN (NITROSTAT) 0.4 MG SL tablet Place 1 tablet (0.4 mg total) under the tongue every 5 (five) minutes as needed for chest pain. 25 tablet 6  . QUEtiapine (SEROQUEL) 25 MG tablet Take 25 mg by mouth 2 (two) times daily.  Discharge Medications: Please see discharge summary for a list of discharge medications.  Relevant Imaging Results:  Relevant Lab Results:   Additional Information SSN# [id number]       Has has Both Moderna vaccine shots  Siddhanth Denk, LCSW

## 2020-02-01 NOTE — ED Provider Notes (Addendum)
Emergency Medicine Observation Re-evaluation Note  [name] is a 75 y.o. male, seen on rounds today.  Pt initially presented to the ED for complaints of Dementia Currently, the patient is resting comfortably in bed.  No concerns per RN at this time.  Physical Exam  BP 137/72 (BP Location: Right Arm)   Pulse 85   Temp 97.8 F (36.6 C) (Axillary)   Resp 16   SpO2 98%  Physical Exam General: Alert, well-appearing no acute distress Cardiac: RRR Lungs: Regular unlabored Psych: Cooperative  ED Course / MDM  EKG:EKG Interpretation  Date/Time:  Sunday January 14 2020 17:39:06 EST Ventricular Rate:  60 PR Interval:    QRS Duration: 100 QT Interval:  460 QTC Calculation: 460 R Axis:   38 Text Interpretation: Sinus rhythm normal Confirmed by Charlesetta Shanks [phone]) on 01/14/2020 7:20:56 PM  Clinical Course as of 02/01/20 1649  Sun Jan 14, 2020  1740 Repeat EKG with more accurate QTc, not prolonged at 474ms. [SJ]    Clinical Course User Index [SJ] Joy, Shawn C, PA-C   I have reviewed the labs performed to date as well as medications administered while in observation.  Recent changes in the last 24 hours include:  Vital signs stable, temperature 97.8 F, pulse 85, blood pressure 137/72, respirations 16, pulse ox 98%.  Plan  Current plan is for [facility] psych placement.  Note: Portions of this report may have been transcribed using voice recognition software. Every effort was made to ensure accuracy; however, inadvertent computerized transcription errors may still be present.   Deliah Boston, PA-C 02/01/20 1653    Deliah Boston, PA-C 02/01/20 1653    Charlesetta Shanks, MD 02/02/20 1654

## 2020-02-02 NOTE — ED Notes (Signed)
Sitter assisting patient with dinner feeding.

## 2020-02-02 NOTE — Progress Notes (Addendum)
CSW spoke with Ebony Hail of [facility] to seek information about review of Pt for placement. Per Ebony Hail, staff is requesting Pt MAR. CSW faxed MAR to DON at [facility] and updated Ebony Hail that information was sent.

## 2020-02-02 NOTE — ED Notes (Signed)
Lunch Tray Ordered @ 1033. 

## 2020-02-02 NOTE — Progress Notes (Signed)
[facility] ([facility]) Community Based Palliative Care       This patient is enrolled in our palliative care services in the community.  [facility] will continue to follow for any discharge planning needs and to coordinate continuation of palliative care.   If you have questions or need assistance, please call [phone] or contact the hospital Liaison listed on AMION.     Thank you for the opportunity to participate in this patient's care.     Chrislyn King, BSN, RN [facility] Hospital Liaison   [phone]   

## 2020-02-02 NOTE — Progress Notes (Signed)
Pt is up to side of bed with max assist help from PT and then began to aggressively lean for ward from the hips.  He is requiring max to total assist to control this, and finally PT had to return him to bed for the safety of the task.  His CNA who is sitting is aware he has moments of being more restless but in bed mainly tries to roll.  Follow for goals of PT and will try to get in chair as his cognition and behavior allow.  02/02/20 1800  PT Visit Information  Last PT Received On 02/02/20  Assistance Needed +2  History of Present Illness Pt is 75 yo male with hx of GERD, hyperlipidemia, HTN, and dementia.  He was brought to ED by wife due to increased agitation and aggressiveness with hallucinations.  Pt is awaiting SNF placement  Subjective Data  Patient Stated Goal unable  Precautions  Precautions Fall  Precaution Comments flat, no real verbalizations  Restrictions  Weight Bearing Restrictions No  Pain Assessment  Pain Assessment No/denies pain  Cognition  Arousal/Alertness Lethargic  Behavior During Therapy Flat affect  Overall Cognitive Status No family/caregiver present to determine baseline cognitive functioning  Area of Impairment Orientation;Attention;Awareness;Problem solving;Following commands;Memory;Safety/judgement  Orientation Level Disoriented to;Person;Place;Time;Situation  Current Attention Level Focused  Memory Decreased recall of precautions;Decreased short-term memory  Following Commands Follows one step commands inconsistently;Follows one step commands with increased time  Safety/Judgement Decreased awareness of deficits;Decreased awareness of safety  Awareness Anticipatory;Emergent;Intellectual  Problem Solving Slow processing;Decreased initiation;Difficulty sequencing;Requires verbal cues;Requires tactile cues  General Comments slow processing of his behaviors that lead to higher fall risk  Bed Mobility  Overal bed mobility Needs Assistance  Bed Mobility Supine  to Sit;Sit to Supine  Supine to sit Mod assist  Sit to supine Max assist  Transfers  Overall transfer level Needs assistance  Transfers Lateral/Scoot Transfers  Sit to Stand Total assist  General transfer comment pt not following instructions for controlling forward balance in sitting, trying to lean forward on side of bed  Ambulation/Gait  General Gait Details non ambulatory  Balance  Overall balance assessment Needs assistance  Sitting-balance support Feet supported  Sitting balance-Leahy Scale Zero  General Comments  General comments (skin integrity, edema, etc.) pt is not fully alert at times and is listing forward, then more aggressively to get OOB.  Exercises  Exercises General Lower Extremity  General Exercises - Lower Extremity  Ankle Circles/Pumps AAROM;5 reps  Quad Sets AAROM;10 reps  Gluteal Sets AAROM;10 reps  PT - End of Session  Activity Tolerance Patient limited by lethargy  Patient left in bed;with call bell/phone within reach  Nurse Communication Mobility status   PT - Assessment/Plan  PT Plan Current plan remains appropriate  PT Visit Diagnosis Muscle weakness (generalized) (M62.81);Unsteadiness on feet (R26.81)  PT Frequency (ACUTE ONLY) Min 2X/week  Recommendations for Other Services Rehab consult  Follow Up Recommendations SNF  PT equipment Wheelchair (measurements PT);Wheelchair cushion (measurements PT);Rolling walker with 5" wheels  AM-PAC PT "6 Clicks" Mobility Outcome Measure (Version 2)  Help needed turning from your back to your side while in a flat bed without using bedrails? 2  Help needed moving from lying on your back to sitting on the side of a flat bed without using bedrails? 2  Help needed moving to and from a bed to a chair (including a wheelchair)? 2  Help needed standing up from a chair using your arms (e.g., wheelchair or bedside chair)? 2  Help needed to walk in hospital room? 1  Help needed climbing 3-5 steps with a railing?  1  6 Click  Score 10  Consider Recommendation of Discharge To: CIR/SNF/LTACH  PT Goal Progression  Progress towards PT goals Not progressing toward goals - comment  PT Time Calculation  PT Start Time (ACUTE ONLY) 1133  PT Stop Time (ACUTE ONLY) 1202  PT Time Calculation (min) (ACUTE ONLY) 29 min  PT General Charges  $$ ACUTE PT VISIT 1 Visit  PT Treatments  $Therapeutic Exercise 8-22 mins  $Therapeutic Activity 8-22 mins    Mee Hives, PT MS Acute Rehab Dept. Number: [place] [facility] [place]

## 2020-02-02 NOTE — ED Provider Notes (Signed)
Emergency Medicine Observation Re-evaluation Note  [name] is a 75 y.o. male, seen on rounds today.  Pt initially presented to the ED for complaints of Dementia  Patient currently has no complaints at this time, he is resting comfortably in bed, tolerating p.o., vital signs reassuring.  Lab work was assessed no acute abnormalities present. Nurses have no new updates on patient.   Physical Exam  BP (!) 153/93 (BP Location: Right Arm)   Pulse 78   Temp (!) 97.5 F (36.4 C) (Axillary)   Resp 17   SpO2 95%  Physical Exam General: Awake, no distress  HEENT: Atraumatic  Resp: Normal effort  Cardiac: Normal rate Abd: Nondistended, nontender  MSK: Moves all extremities without difficulty Neuro: Speech clear  ED Course / MDM  EKG:EKG Interpretation  Date/Time:  Sunday January 14 2020 17:39:06 EST Ventricular Rate:  60 PR Interval:    QRS Duration: 100 QT Interval:  460 QTC Calculation: 460 R Axis:   38 Text Interpretation: Sinus rhythm normal Confirmed by Charlesetta Shanks [phone]) on 01/14/2020 7:20:56 PM  Clinical Course as of 02/02/20 1400  Sun Jan 14, 2020  1740 Repeat EKG with more accurate QTc, not prolonged at 468ms. [SJ]    Clinical Course User Index [SJ] Joy, Shawn C, PA-C   I have reviewed the labs performed to date as well as medications administered while in observation. no Recent changes in the last 24 hours include.   Plan  Current plan is for his await for geriatric psych placement.  pending [facility] in [place], social work is waiting for bed placement there. We will continue with current treatment plan and monitor . Patient is not under full IVC at this time.   Marcello Fennel, PA-C 02/02/20 1410    Wyvonnia Dusky, MD 02/02/20 [phone]

## 2020-02-02 NOTE — ED Notes (Signed)
Sitter assisted pt with feeding breakfast; pt was able to tolerate soft foods.

## 2020-02-04 NOTE — ED Notes (Signed)
This RN spoke with pt's wife Lelon Frohlich by phone to update on pt's status. Ann spoke with pt on speakerphone to wish him a happy anniversary. Pt mouthed by did not speak.  Pt calm and alert at this time.  Ann denied new questions related to plan of care.

## 2020-02-04 NOTE — ED Notes (Signed)
Attempted to feed pt lunch. Pushed my hand away after multiple attempts. Will try again later.

## 2020-02-04 NOTE — ED Notes (Signed)
Pt is resting in bed at this time. Pt will slightly open eyes when writer calls his name out. Pt does not respond verbally. Was reported that this has been baseline for pt. Vitals obtained and stable. Will continue to monitor.

## 2020-02-04 NOTE — ED Notes (Signed)
Lunch Tray Ordered @ 1037. 

## 2020-02-05 NOTE — ED Notes (Signed)
Breakfast Ordered 

## 2020-02-05 NOTE — ED Notes (Signed)
Lunch Tray Ordered @ 1109. °

## 2020-02-05 NOTE — ED Notes (Signed)
Pt incontinent of bowel and bladder. Brief, pad and gown changed. Pt repositioned himself to his side. No complaints at this time.

## 2020-02-05 NOTE — ED Notes (Signed)
Patient compliant with sitter taking vitals. Patient given applesauce with medication administration, resting comfortably in bed. Patient noted to be soiled, full linen changed and new brief place after peri-care was provided. Patient with moderate size BM. Patient now clean and dry at this time.

## 2020-02-05 NOTE — ED Notes (Signed)
Patient calm resting comfortably in bed, patient able to turn self independently, no complaints or signs of pain at this time.

## 2020-02-05 NOTE — ED Provider Notes (Signed)
Patient is resting comfortably.  He is awaiting placement in a skilled nursing facility after being here for 7 days.  Vital signs are reassuring.   Daleen Bo, MD 02/05/20 [phone]

## 2020-02-05 NOTE — ED Notes (Signed)
Pt is resting at this time

## 2020-02-05 NOTE — ED Notes (Signed)
Wife called to check on pt.  Pt given meal tray

## 2020-02-05 NOTE — ED Notes (Signed)
Pt spilled his meal tray after having been fed about 1/3 of it.  Offered him more food after his sheets were changed.  Pt declined.

## 2020-02-05 NOTE — ED Notes (Signed)
Pt brief checked and dry at this time.

## 2020-02-05 NOTE — Progress Notes (Signed)
CSW awaiting calback from [facility].  Will follow up with [facility] director on 1/18 for further assistance.

## 2020-02-05 NOTE — ED Notes (Signed)
Pt fed his dinner

## 2020-02-06 ENCOUNTER — Emergency Department (HOSPITAL_COMMUNITY): Payer: Medicare Other

## 2020-02-06 ENCOUNTER — Inpatient Hospital Stay (HOSPITAL_COMMUNITY): Payer: Medicare Other

## 2020-02-06 DIAGNOSIS — R54 Age-related physical debility: Secondary | ICD-10-CM | POA: Diagnosis present

## 2020-02-06 DIAGNOSIS — R441 Visual hallucinations: Secondary | ICD-10-CM | POA: Diagnosis present

## 2020-02-06 DIAGNOSIS — G300 Alzheimer's disease with early onset: Secondary | ICD-10-CM | POA: Diagnosis not present

## 2020-02-06 DIAGNOSIS — F0281 Dementia in other diseases classified elsewhere with behavioral disturbance: Secondary | ICD-10-CM | POA: Diagnosis present

## 2020-02-06 DIAGNOSIS — G309 Alzheimer's disease, unspecified: Secondary | ICD-10-CM

## 2020-02-06 DIAGNOSIS — T17908A Unspecified foreign body in respiratory tract, part unspecified causing other injury, initial encounter: Secondary | ICD-10-CM | POA: Diagnosis not present

## 2020-02-06 DIAGNOSIS — Z515 Encounter for palliative care: Secondary | ICD-10-CM | POA: Diagnosis not present

## 2020-02-06 DIAGNOSIS — E785 Hyperlipidemia, unspecified: Secondary | ICD-10-CM | POA: Diagnosis present

## 2020-02-06 DIAGNOSIS — R0902 Hypoxemia: Secondary | ICD-10-CM

## 2020-02-06 DIAGNOSIS — I2510 Atherosclerotic heart disease of native coronary artery without angina pectoris: Secondary | ICD-10-CM | POA: Diagnosis present

## 2020-02-06 DIAGNOSIS — K219 Gastro-esophageal reflux disease without esophagitis: Secondary | ICD-10-CM | POA: Diagnosis present

## 2020-02-06 DIAGNOSIS — Z20822 Contact with and (suspected) exposure to covid-19: Secondary | ICD-10-CM | POA: Diagnosis present

## 2020-02-06 DIAGNOSIS — J690 Pneumonitis due to inhalation of food and vomit: Secondary | ICD-10-CM | POA: Diagnosis present

## 2020-02-06 DIAGNOSIS — Z820 Family history of epilepsy and other diseases of the nervous system: Secondary | ICD-10-CM | POA: Diagnosis not present

## 2020-02-06 DIAGNOSIS — Z955 Presence of coronary angioplasty implant and graft: Secondary | ICD-10-CM | POA: Diagnosis not present

## 2020-02-06 DIAGNOSIS — Z96652 Presence of left artificial knee joint: Secondary | ICD-10-CM | POA: Diagnosis present

## 2020-02-06 DIAGNOSIS — Z789 Other specified health status: Secondary | ICD-10-CM

## 2020-02-06 DIAGNOSIS — Z66 Do not resuscitate: Secondary | ICD-10-CM

## 2020-02-06 DIAGNOSIS — E669 Obesity, unspecified: Secondary | ICD-10-CM | POA: Diagnosis present

## 2020-02-06 DIAGNOSIS — U071 COVID-19: Secondary | ICD-10-CM | POA: Diagnosis present

## 2020-02-06 DIAGNOSIS — R451 Restlessness and agitation: Secondary | ICD-10-CM | POA: Diagnosis present

## 2020-02-06 DIAGNOSIS — Z7189 Other specified counseling: Secondary | ICD-10-CM

## 2020-02-06 DIAGNOSIS — Z951 Presence of aortocoronary bypass graft: Secondary | ICD-10-CM | POA: Diagnosis not present

## 2020-02-06 DIAGNOSIS — R410 Disorientation, unspecified: Secondary | ICD-10-CM | POA: Diagnosis present

## 2020-02-06 DIAGNOSIS — M1990 Unspecified osteoarthritis, unspecified site: Secondary | ICD-10-CM | POA: Diagnosis present

## 2020-02-06 DIAGNOSIS — I10 Essential (primary) hypertension: Secondary | ICD-10-CM | POA: Diagnosis present

## 2020-02-06 DIAGNOSIS — J9601 Acute respiratory failure with hypoxia: Secondary | ICD-10-CM | POA: Diagnosis present

## 2020-02-06 DIAGNOSIS — Z85828 Personal history of other malignant neoplasm of skin: Secondary | ICD-10-CM | POA: Diagnosis not present

## 2020-02-06 DIAGNOSIS — Z8249 Family history of ischemic heart disease and other diseases of the circulatory system: Secondary | ICD-10-CM | POA: Diagnosis not present

## 2020-02-06 LAB — CBC WITH DIFFERENTIAL/PLATELET
Abs Immature Granulocytes: 0.04 10*3/uL (ref 0.00–0.07)
Basophils Absolute: 0 10*3/uL (ref 0.0–0.1)
Basophils Relative: 0 %
Eosinophils Absolute: 0 10*3/uL (ref 0.0–0.5)
Eosinophils Relative: 0 %
HCT: 46.2 % (ref 39.0–52.0)
Hemoglobin: 15.8 g/dL (ref 13.0–17.0)
Immature Granulocytes: 0 %
Lymphocytes Relative: 11 %
Lymphs Abs: 1.2 10*3/uL (ref 0.7–4.0)
MCH: 31.9 pg (ref 26.0–34.0)
MCHC: 34.2 g/dL (ref 30.0–36.0)
MCV: 93.1 fL (ref 80.0–100.0)
Monocytes Absolute: 0.8 10*3/uL (ref 0.1–1.0)
Monocytes Relative: 8 %
Neutro Abs: 8.3 10*3/uL — ABNORMAL HIGH (ref 1.7–7.7)
Neutrophils Relative %: 81 %
Platelets: 245 10*3/uL (ref 150–400)
RBC: 4.96 MIL/uL (ref 4.22–5.81)
RDW: 13.2 % (ref 11.5–15.5)
WBC: 10.3 10*3/uL (ref 4.0–10.5)
nRBC: 0 % (ref 0.0–0.2)

## 2020-02-06 LAB — BASIC METABOLIC PANEL
Anion gap: 12 (ref 5–15)
BUN: 17 mg/dL (ref 8–23)
CO2: 27 mmol/L (ref 22–32)
Calcium: 9.2 mg/dL (ref 8.9–10.3)
Chloride: 100 mmol/L (ref 98–111)
Creatinine, Ser: 1.31 mg/dL — ABNORMAL HIGH (ref 0.61–1.24)
GFR, Estimated: 58 mL/min — ABNORMAL LOW (ref >60)
Glucose, Bld: 122 mg/dL — ABNORMAL HIGH (ref 70–99)
Potassium: 4.1 mmol/L (ref 3.5–5.1)
Sodium: 139 mmol/L (ref 135–145)

## 2020-02-06 LAB — I-STAT CHEM 8, ED
BUN: 20 mg/dL (ref 8–23)
Calcium, Ion: 1.15 mmol/L (ref 1.15–1.40)
Chloride: 100 mmol/L (ref 98–111)
Creatinine, Ser: 1.2 mg/dL (ref 0.61–1.24)
Glucose, Bld: 120 mg/dL — ABNORMAL HIGH (ref 70–99)
HCT: 47 % (ref 39.0–52.0)
Hemoglobin: 16 g/dL (ref 13.0–17.0)
Potassium: 4 mmol/L (ref 3.5–5.1)
Sodium: 141 mmol/L (ref 135–145)
TCO2: 29 mmol/L (ref 22–32)

## 2020-02-06 LAB — RESP PANEL BY RT-PCR (RSV, FLU A&B, COVID)  RVPGX2
Influenza A by PCR: NEGATIVE
Influenza B by PCR: NEGATIVE
Resp Syncytial Virus by PCR: NEGATIVE
SARS Coronavirus 2 by RT PCR: POSITIVE — AB

## 2020-02-06 MED ORDER — BIOTENE DRY MOUTH MT LIQD
15.0000 mL | Freq: Two times a day (BID) | OROMUCOSAL | Status: DC
  Administered 2020-02-07: 15 mL via TOPICAL

## 2020-02-06 MED ORDER — GUAIFENESIN-DM 100-10 MG/5ML PO SYRP: 10.0000 mL | ORAL_SOLUTION | ORAL | Status: DC | PRN

## 2020-02-06 MED ORDER — ONDANSETRON HCL 4 MG PO TABS: 4.0000 mg | ORAL_TABLET | Freq: Four times a day (QID) | ORAL | Status: DC | PRN

## 2020-02-06 MED ORDER — LORAZEPAM 2 MG/ML IJ SOLN: 1.0000 mg | INTRAMUSCULAR | Status: DC | PRN

## 2020-02-06 MED ORDER — HALOPERIDOL 1 MG PO TABS
2.0000 mg | ORAL_TABLET | Freq: Four times a day (QID) | ORAL | Status: DC | PRN
  Filled 2020-02-06: qty 2

## 2020-02-06 MED ORDER — ONDANSETRON HCL 4 MG/2ML IJ SOLN: 4.0000 mg | Freq: Four times a day (QID) | INTRAMUSCULAR | Status: DC | PRN

## 2020-02-06 MED ORDER — LORAZEPAM 2 MG/ML PO CONC
1.0000 mg | ORAL | Status: DC | PRN
  Administered 2020-02-07: 1 mg via SUBLINGUAL
  Filled 2020-02-06: qty 1

## 2020-02-06 MED ORDER — HALOPERIDOL LACTATE 5 MG/ML IJ SOLN: 2.0000 mg | Freq: Four times a day (QID) | INTRAMUSCULAR | Status: DC | PRN

## 2020-02-06 MED ORDER — GLYCOPYRROLATE 0.2 MG/ML IJ SOLN: 0.2000 mg | INTRAMUSCULAR | Status: DC | PRN

## 2020-02-06 MED ORDER — GLYCOPYRROLATE 1 MG PO TABS: 1.0000 mg | ORAL_TABLET | ORAL | Status: DC | PRN

## 2020-02-06 MED ORDER — ENOXAPARIN SODIUM 40 MG/0.4ML ~~LOC~~ SOLN: 40.0000 mg | SUBCUTANEOUS | Status: DC

## 2020-02-06 MED ORDER — IPRATROPIUM-ALBUTEROL 20-100 MCG/ACT IN AERS
1.0000 | INHALATION_SPRAY | Freq: Four times a day (QID) | RESPIRATORY_TRACT | Status: DC
  Filled 2020-02-06: qty 4

## 2020-02-06 MED ORDER — ACETAMINOPHEN 325 MG PO TABS: 650.0000 mg | ORAL_TABLET | Freq: Four times a day (QID) | ORAL | Status: DC | PRN

## 2020-02-06 MED ORDER — POLYVINYL ALCOHOL 1.4 % OP SOLN: 1.0000 [drp] | Freq: Four times a day (QID) | OPHTHALMIC | Status: DC | PRN

## 2020-02-06 MED ORDER — QUETIAPINE FUMARATE 25 MG PO TABS: 25.0000 mg | ORAL_TABLET | Freq: Two times a day (BID) | ORAL | Status: DC

## 2020-02-06 MED ORDER — ACETAMINOPHEN 500 MG PO TABS: 500.0000 mg | ORAL_TABLET | Freq: Every day | ORAL | Status: DC | PRN

## 2020-02-06 MED ORDER — IPRATROPIUM-ALBUTEROL 20-100 MCG/ACT IN AERS: 1.0000 | INHALATION_SPRAY | RESPIRATORY_TRACT | Status: DC | PRN

## 2020-02-06 MED ORDER — ACETAMINOPHEN 650 MG RE SUPP
650.0000 mg | Freq: Four times a day (QID) | RECTAL | Status: DC | PRN
  Administered 2020-02-06: 650 mg via RECTAL
  Filled 2020-02-06: qty 1

## 2020-02-06 MED ORDER — IPRATROPIUM-ALBUTEROL 20-100 MCG/ACT IN AERS: 1.0000 | INHALATION_SPRAY | Freq: Four times a day (QID) | RESPIRATORY_TRACT | Status: DC

## 2020-02-06 MED ORDER — HALOPERIDOL LACTATE 2 MG/ML PO CONC: 2.0000 mg | Freq: Four times a day (QID) | ORAL | Status: DC | PRN

## 2020-02-06 MED ORDER — FENTANYL CITRATE (PF) 100 MCG/2ML IJ SOLN: 50.0000 ug | INTRAMUSCULAR | Status: DC | PRN

## 2020-02-06 NOTE — H&P (Signed)
History and Physical    [name] [id number] DOB: 1945-03-06 DOA: 01/14/2020  PCP: Chesley Noon, MD (Confirm with patient/family/NH records and if not entered, this has to be entered at [facility] point of entry) Patient coming from: Home  I have personally briefly reviewed patient's old medical records in [place]  Chief Complaint: Vomit and SOB  HPI: [name] is a 75 y.o. male with medical history significant of Alzheimer's dementia, CAD, HTN, HLD, developed vomiting and hypoxia.  Patient came to ED 01/14/2020 for evaluation of increasing confusion and agitation at home. ER staff and case management have been working on patient case and plan to discharge patient to geriatric psych facility. Patient has been staying in the ED since 01/14/2020. He was started 3 times for COVID-19 and negative on 12/26, 01/04 and 01/10.  Last night, patient became more agitated and vomited x1 and became hypoxic this morning and continued to have gurgling sound and coughing. Patient was suctioned and stat x-ray did not show significant infiltrate however patient then developed hypoxia was found O2 sat ration in the upper 80s. Then patient COVID test came back positive.   Review of Systems: Unable to perform patient demented Past Medical History:  Diagnosis Date  . Arthritis   . CAD (coronary artery disease) 09/18/2010   Myoview 8/20: EF 62, mild apical thinning, no ischemia, low risk study  . Chest pain   . Colon polyp   . Coronary artery disease    post PTCA and stent of the distal RCA. He has a 3.0 mm espress 2 stent deployed at 14 atmospheres placed in [place], [place]            . Dyslipidemia   . Erectile dysfunction   . GERD (gastroesophageal reflux disease)   . Headache   . Hyperlipidemia   . Hypertension   . Kidney stones    history   . Memory loss   . Skin cancer 2015    Past Surgical History:  Procedure Laterality Date  . BACK SURGERY    . CAROTID ENDARTERECTOMY     . COLONOSCOPY    . CORONARY ANGIOPLASTY WITH STENT PLACEMENT     post PTCA and stent of the distal RCA. He has a 3.0 mm espress 2 stent deployed at 14 atmospheres placed in [place], [place]  06/2014  . EYE SURGERY    . HERNIA REPAIR    . JOINT REPLACEMENT     left knee  . POLYPECTOMY    . TOTAL KNEE ARTHROPLASTY     left     reports that he has never smoked. He has quit using smokeless tobacco.  His smokeless tobacco use included chew. He reports current alcohol use. He reports that he does not use drugs.  Allergies  Allergen Reactions  . Oxycodone-Acetaminophen Other (See Comments)    Pt saw bugs    Family History  Problem Relation Age of Onset  . Aortic aneurysm Father   . Cancer Father        colon   . Coronary artery disease Mother        with CABG  . Alzheimer's disease Mother   . Hypertension Brother     Prior to Admission medications   Medication Sig Start Date End Date Taking? Authorizing Provider  acetaminophen (TYLENOL) 500 MG tablet Take 500 mg by mouth daily as needed for headache (  pain).   Yes [provider]  ALPRAZolam Duanne Moron) 0.5 MG tablet Take 0.5 mg by mouth See admin instructions. Take one tablet (0.5 mg) by mouth every 5-6 hours while awake 12/26/19  Yes [provider]  nitroGLYCERIN (NITROSTAT) 0.4 MG SL tablet Place 1 tablet (0.4 mg total) under the tongue every 5 (five) minutes as needed for chest pain. 09/23/18 04/11/19 Yes Nahser, Wonda Cheng, MD  QUEtiapine (SEROQUEL) 25 MG tablet Take 25 mg by mouth 2 (two) times daily. 01/04/20  Yes [provider]    Physical Exam: Vitals:   02/06/20 0533 02/06/20 1220 02/06/20 1830 02/06/20 1850  BP: 134/87 (!) 133/97 (!) 138/94   Pulse: 91 99 (!) 132   Resp: 18 18 (!) 30   Temp: 99.9 F (37.7 C) 99.1 F (37.3 C) (!) 102.6 F (39.2 C) (!) 103.8 F (39.9 C)  TempSrc: Axillary Oral Axillary Rectal  SpO2: 95% (!) 84% 96%     Constitutional:  NAD, calm, comfortable Vitals:   02/06/20 0533 02/06/20 1220 02/06/20 1830 02/06/20 1850  BP: 134/87 (!) 133/97 (!) 138/94   Pulse: 91 99 (!) 132   Resp: 18 18 (!) 30   Temp: 99.9 F (37.7 C) 99.1 F (37.3 C) (!) 102.6 F (39.2 C) (!) 103.8 F (39.9 C)  TempSrc: Axillary Oral Axillary Rectal  SpO2: 95% (!) 84% 96%    Eyes: PERRL, lids and conjunctivae normal ENMT: Mucous membranes are moist. Posterior pharynx clear of any exudate or lesions.Normal dentition.  Neck: normal, supple, no masses, no thyromegaly Respiratory: Diminished breathing sound on the right side, no wheezing, coarse crackle on right lung base crackles. Increasing respiratory effort. No accessory muscle use.  Cardiovascular: Regular rate and rhythm, no murmurs / rubs / gallops. No extremity edema. 2+ pedal pulses. No carotid bruits.  Abdomen: no tenderness, no masses palpated. No hepatosplenomegaly. Bowel sounds positive.  Musculoskeletal: no clubbing / cyanosis. No joint deformity upper and lower extremities. Good ROM, no contractures. Normal muscle tone.  Skin: no rashes, lesions, ulcers. No induration Neurologic: No facial droop, moving all limbs, not following commands. Psychiatric: Opens eyes, sedated.     Labs on Admission: I have personally reviewed following labs and imaging studies  CBC: Recent Labs  Lab 02/06/20 1303 02/06/20 1447  WBC 10.3  --   NEUTROABS 8.3*  --   HGB 15.8 16.0  HCT 46.2 47.0  MCV 93.1  --   PLT 245  --    Basic Metabolic Panel: Recent Labs  Lab 02/06/20 1303 02/06/20 1447  NA 139 141  K 4.1 4.0  CL 100 100  CO2 27  --   GLUCOSE 122* 120*  BUN 17 20  CREATININE 1.31* 1.20  CALCIUM 9.2  --    GFR: CrCl cannot be calculated (Unknown ideal weight.). Liver Function Tests: No results for input(s): AST, ALT, ALKPHOS, BILITOT, PROT, ALBUMIN in the last 168 hours. No results for input(s): LIPASE, AMYLASE in the last 168 hours. No results for input(s): AMMONIA in the  last 168 hours. Coagulation Profile: No results for input(s): INR, PROTIME in the last 168 hours. Cardiac Enzymes: No results for input(s): CKTOTAL, CKMB, CKMBINDEX, TROPONINI in the last 168 hours. BNP (last 3 results) No results for input(s): PROBNP in the last 8760 hours. HbA1C: No results for input(s): HGBA1C in the last 72 hours. CBG: No results for input(s): GLUCAP in the last 168 hours. Lipid Profile: No results for input(s): CHOL, HDL, LDLCALC, TRIG, CHOLHDL, LDLDIRECT in  the last 72 hours. Thyroid Function Tests: No results for input(s): TSH, T4TOTAL, FREET4, T3FREE, THYROIDAB in the last 72 hours. Anemia Panel: No results for input(s): VITAMINB12, FOLATE, FERRITIN, TIBC, IRON, RETICCTPCT in the last 72 hours. Urine analysis:    Component Value Date/Time   COLORURINE YELLOW 01/14/2020 1823   APPEARANCEUR CLEAR 01/14/2020 1823   LABSPEC 1.008 01/14/2020 1823   PHURINE 5.0 01/14/2020 1823   GLUCOSEU NEGATIVE 01/14/2020 1823   HGBUR SMALL (A) 01/14/2020 1823   BILIRUBINUR NEGATIVE 01/14/2020 1823   KETONESUR NEGATIVE 01/14/2020 1823   PROTEINUR NEGATIVE 01/14/2020 1823   UROBILINOGEN 1.0 06/19/2008 0920   NITRITE NEGATIVE 01/14/2020 1823   LEUKOCYTESUR NEGATIVE 01/14/2020 1823    Radiological Exams on Admission: DG Chest 1 View  Result Date: 02/06/2020 CLINICAL DATA:  Hypoxia EXAM: CHEST  1 VIEW COMPARISON:  02/06/2020, 01/14/2020 FINDINGS: Post sternotomy changes. The right lung is grossly clear. Increasing atelectasis or mild infiltrate left base. Stable cardiomediastinal silhouette. No pneumothorax. Aortic atherosclerosis. IMPRESSION: Increasing atelectasis or mild infiltrate left base. Electronically Signed   By: Donavan Foil M.D.   On: 02/06/2020 18:03   DG Chest Port 1 View  Result Date: 02/06/2020 CLINICAL DATA:  Cough.  Hypertension. EXAM: PORTABLE CHEST 1 VIEW COMPARISON:  01/14/2020 FINDINGS: Reverse apical lordotic positioning. Prior median sternotomy.  Midline trachea. Normal heart size. Mild left hemidiaphragm elevation. No pleural effusion or pneumothorax. Mild left base subsegmental atelectasis or scar. Clear right lung. IMPRESSION: No acute cardiopulmonary disease. Electronically Signed   By: Abigail Miyamoto M.D.   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 (For example, if patient is on BP meds at home and you resume or decide to hold them, it is a problem that needs to be her. Same for CAD, COPD, HLD and so on)  Acute hypoxic respiratory failure -Likely had aspiration event, now suspect aspiration pneumonitis/early aspiration pneumonia. Physical exam showed right lower field crackles as well as decreased breathing sound support the diagnosis of right-sided pneumonia. -Repeat x-ray however did not show significant right lower lobe infiltrates. Suspect pneumonia still brewing this moment. -Palliative care was consulted in the ED, who came down and hold a discussion with patient's wife and after thoroughly evaluating on patient's conditiosn especially the seemingly increased risk of recurrent aspiration events given his worsening of mentation, and on the other hand, alternative treatment option such as feeding tube is not desired by patient family, family decided to change goals of care and pursue comfort measures only. -Admitted to palliative care bridging for home hospice vs SNF hospice. -COVID-19 pneumonia less likely.  Alzheimer's dementia/HTN/HLD/CAD -No further treatment  DVT prophylaxis: None Code Status: DNR/DNI Family Communication: Wife Disposition Plan: Palliative care Consults called: Palliative care Admission status: Palliative care admission   Lequita Halt MD [facility] Pager  [phone]  02/06/2020, 7:14 PM

## 2020-02-06 NOTE — Progress Notes (Signed)
[employer] [facility] Community Based Palliative Care       This patient is enrolled in our palliative care services in the community.  [facility] will continue to follow for any discharge planning needs and to coordinate continuation of palliative care.    Due to recent decline pt could be evaluated for hospice services by [facility] MD if desired by family.  Thank you for the opportunity to participate in this patient's care.     Domenic Moras, BSN, RN [facility] Liaison   [phone]

## 2020-02-06 NOTE — Progress Notes (Addendum)
[employer]ufacturing [employer] [facility])  **Addendum: Discussed plan of care with Elmer Picker, NP with PMT, who clarified referral is now for residential hospice.  Unfortunately, neither [facility] is able to accept Covid+ pts at this time.  [place] liaisons will follow up with family tomorrow and will be available should family wish for home with hospice services.  Received request from [facility] for hospice services at home after discharge.  Chart and pt information under review by [facility] physician.  Hospice eligibility pending at this time.  [place] liaison team will try to speak with family tomorrow to continue education on hospice services and to assess for DME needs. Discussion had by phone earlier today with daughter regarding outpatient palliative services vs. Hospice services.   Thank you for the opportunity to participate in this pt's care.  Domenic Moras, BSN, RN [facility] [phone] (24h on call)

## 2020-02-06 NOTE — Progress Notes (Signed)
Physical Therapy Treatment Patient Details Name: [name] MRN: [id number] DOB: 08/15/1945 Today's Date: 02/06/2020    History of Present Illness Pt is 75 yo male with hx of GERD, hyperlipidemia, HTN, and dementia.  He was brought to ED by wife due to increased agitation and aggressiveness with hallucinations.  Pt is awaiting SNF placement    PT Comments    Pt lethargic today and not alert at all until sitting EOB. Pt showed positional insecurity in sitting with fear of falling bkwds. Strong fwd lean with max A needed to center. Once in neutral pt could maintain this position 15-20 secs before fwd lean again. Attempted sit>stand and though pt leans fwd, would not wt shift fwd onto feet to initiate stand. In addition, pt with extremely narrow BOS with therapist needing to prevent R foot from being on top of L. Pt with increased coughing today and bed placed in chair position after session for more erect posture. PT will continue to follow.   Follow Up Recommendations  SNF     Equipment Recommendations  Wheelchair (measurements PT);Wheelchair cushion (measurements PT);Rolling walker with 5" wheels    Recommendations for Other Services       Precautions / Restrictions Precautions Precautions: Fall Precaution Comments: flat, no real verbalizations Restrictions Weight Bearing Restrictions: No    Mobility  Bed Mobility Overal bed mobility: Needs Assistance Bed Mobility: Supine to Sit;Sit to Supine     Supine to sit: Max assist;+2 for physical assistance Sit to supine: Max assist;+2 for physical assistance   General bed mobility comments: pt seemed fearful with change in position, sometimes resistant  Transfers Overall transfer level: Needs assistance Equipment used: 1 person hand held assist   Sit to Stand: Total assist         General transfer comment: attempted sit>stand but pt not initiating any push through feet. Worked on fwd wt shift to initiate sit>stand as  well as maintaining space between feet as pt kept stepping R on L.  Ambulation/Gait             General Gait Details: unable   Stairs             Wheelchair Mobility    Modified Rankin (Stroke Patients Only)       Balance Overall balance assessment: Needs assistance Sitting-balance support: Feet supported Sitting balance-Leahy Scale: Zero Sitting balance - Comments: fwd lean requiring max A to return to center. Once centered, pt could maintain balance for 15-20 secs with close guarding before returning to fwd lean. Worked on erect posture in sitting       Standing balance comment: unable to come up to stand, fearful to move                            Cognition Arousal/Alertness: Lethargic Behavior During Therapy: Flat affect Overall Cognitive Status: No family/caregiver present to determine baseline cognitive functioning Area of Impairment: Orientation;Attention;Awareness;Problem solving;Following commands;Memory;Safety/judgement                 Orientation Level: Disoriented to;Person;Place;Time;Situation Current Attention Level: Focused Memory: Decreased recall of precautions;Decreased short-term memory Following Commands: Follows one step commands inconsistently;Follows one step commands with increased time Safety/Judgement: Decreased awareness of deficits;Decreased awareness of safety Awareness: Anticipatory;Emergent;Intellectual Problem Solving: Slow processing;Decreased initiation;Difficulty sequencing;Requires verbal cues;Requires tactile cues General Comments: pt nonverbal this session. Showed increased positional insecurity in sitting      Exercises General Exercises - Lower Extremity Long  Arc [name];Both;10 reps;Seated    General Comments General comments (skin integrity, edema, etc.): pt's bed placed in chair position end of session to promote better breathing and coughing. Sitter present      Pertinent Vitals/Pain Pain  Assessment: Faces Faces Pain Scale: Hurts little more Pain Location: generalized Pain Descriptors / Indicators: Grimacing Pain Intervention(s): Limited activity within patient's tolerance;Monitored during session    Home Living                      Prior Function            PT Goals (current goals can now be found in the care plan section) Acute Rehab PT Goals Patient Stated Goal: unable PT Goal Formulation: Patient unable to participate in goal setting Time For Goal Achievement: 02/14/20 Potential to Achieve Goals: Fair Progress towards PT goals: Not progressing toward goals - comment (lethargy)    Frequency    Min 2X/week      PT Plan Current plan remains appropriate    Co-evaluation              AM-PAC PT "6 Clicks" Mobility   Outcome Measure  Help needed turning from your back to your side while in a flat bed without using bedrails?: A Lot Help needed moving from lying on your back to sitting on the side of a flat bed without using bedrails?: A Lot Help needed moving to and from a bed to a chair (including a wheelchair)?: Total Help needed standing up from a chair using your arms (e.g., wheelchair or bedside chair)?: Total Help needed to walk in hospital room?: Total Help needed climbing 3-5 steps with a railing? : Total 6 Click Score: 8    End of Session   Activity Tolerance: Patient limited by lethargy Patient left: in bed;with call bell/phone within reach;with bed alarm set Nurse Communication: Mobility status PT Visit Diagnosis: Muscle weakness (generalized) (M62.81);Unsteadiness on feet (R26.81)     Time: 0175-1025 PT Time Calculation (min) (ACUTE ONLY): 16 min  Charges:  $Therapeutic Activity: 8-22 mins                     Leighton Roach, [place]  Pager [phone] Office [place] 02/06/2020, 2:20 PM

## 2020-02-06 NOTE — ED Notes (Signed)
Reported attempted.  req made to call back at [facility].

## 2020-02-06 NOTE — ED Notes (Signed)
Pt has moist cough, unable to cough sputum up, gurgling in back of throat- attempted to suction with [place]- minimal results for food and sputum. MD notified- will not attempt to feed at this time--

## 2020-02-06 NOTE — ED Notes (Signed)
Geri psych Breakfast Ordered

## 2020-02-06 NOTE — ED Notes (Signed)
sats are 88-% on 7L/M/[place] suctioned back of throat for small amount of mucus-

## 2020-02-06 NOTE — Consult Note (Incomplete)
Consultation Note Date: 02/06/2020   Patient Name: [name]  DOB: 25-Aug-1945  MRN: [id number]  Age / Sex: 75 y.o., male  PCP: Chesley Noon, MD Referring Physician: Lorelle Gibbs, DO  Reason for Consultation: Disposition and Establishing goals of care  HPI/Patient Profile: 75 y.o. male  with past medical history of skin cancer, Alzheimer's dementia, CAD, HTN presented to the ED on 01/14/20 from home with family complaints of patient's increasing confusion and agitation at home. Plan was for patient to discharge to a geriatric psych facility but have been unable to find placement - patient has been staying in the ED since 01/14/20.   Family face treatment option decisions and anticipatory care needs.   Clinical Assessment and Goals of Care: I have reviewed medical records including EPIC notes, labs, and imaging. Received report from primary RN - acute concerns that patient seems to be declining. RN reports patient remains very weak, not even able to sit up in the bed without assistance, stating "patient struggled."    Went to visit patient at bedside - no family/visitors present. Patient was lying in bed - he does wake to voice/gentle touch but is nonresponsive/noncommunicative. No signs or non-verbal gestures of pain or discomfort noted. No respiratory distress, increased work of breathing, or secretions noted.   Called wife/Ann to discuss diagnosis, prognosis, GOC, EOL wishes, disposition, and options.  I introduced Palliative Medicine as specialized medical care for people living with serious illness. It focuses on providing relief from the symptoms and stress of a serious illness. The goal is to improve quality of life for both the patient and the family.  We discussed a brief life review of the patient as well as functional and nutritional status. The patient and Lelon Frohlich have been married for 29  years and Lelon Frohlich describes the marriage as "beautiful." The patient has one son from a previous marriage. Prior to hospitalization, the patient was living in a private residence with Lelon Frohlich. Lelon Frohlich noticed a steady physical and mental decline in the patient since November 20, 2019. Prior to this date, the patient could accompany Ann with errands and shopping. After this date Webb Silversmith states the patient became more confused, aggravated, hallucinating, and developed a fever (which he did have work up for but was unremarkable). The patient was able to ambulate independently and perform all ADL's until the last two weeks when he started to need assistance. Lelon Frohlich was able to provide the assistance he needed until the last 3-4 days when her daughter and the patient's son started helping with his care. The symptoms increased, which prompted them to bring him to the ED. Ann states at the time he presented to the ED he was eating and drinking well.   We discussed patient's current illness and what it means in the larger context of patient's on-going co-morbidities. Lelon Frohlich has a clear understanding of the patient's current medical situation. Ann understands that dementia is a progressive, non-curable disease underlying the patient's current acute medical conditions. We reviewed the swallow evaluation  and how the patient is at severe aspiration risk. We also reviewed PT's note that patient is max assist and requiring 24/7 supervision/assistance as well as what continues to be a gradual decline despite interventions in the hospital. Natural disease trajectory for dementia and expectations at EOL were discussed. I attempted to elicit values and goals of care important to the patient. The difference between aggressive medical intervention and comfort care was considered in light of the patient's goals of care. Lelon Frohlich tells me that the patient has "been through so much" and her goal for him at this time is to be comfortable as possible. Lelon Frohlich states  that she has worked with hospice services in the past and feels their care is wonderful. Therapeutic listening was provided as she reflected on her previous hospice experiences. We talked about transition to comfort measures in house and what that would entail inclusive of medications to control pain, dyspnea, agitation, nausea, itching, and hiccups. We discussed stopping all uneccessary measures such as blood draws, needle sticks, oxygen, antibiotics, CBGs/insulin, cardiac monitoring, and frequent vital signs. Lelon Frohlich was agreeable to initiate full comfort measures in house today. Lelon Frohlich is not interested in pursuing a feeding tube or coretrack as she recognizes this intervention comes with it's own risks and does not necessarily provide quality or comfort. We discussed careful hand feeding - Lelon Frohlich is willing to accept the risk and would like to provide careful hand feeds for comfort if patient is accepting of food/drink.  Home hospice vs residential hospice services were discussed. Lelon Frohlich has concerns that she/family will not be able to meet the patient's needed level of care at home, she is most interested for the patient to transfer to residential hospice facility, requesting [facility].   Code status was discussed - Lelon Frohlich states she and the patient have discussed his wishes about resuscitation in the past and the patient already has DNR paperwork at home. She was told she needed to bring the form to the hospital for it to be activated/have code status chagned in house. I educated that with our conversation today and with goals being clear, I could change code status to DNR/DNI without her driving in snow/ice to bring [place] his DNR form - she was appreciative. Ann understands that DNR/DNI means that we will allow the patient to pass peacefully instead of attempting aggressive interventions such as chest compressions, shocking, ACLS medications, or intubation. She is agreeable for DNR/DNI code status change today.  Visit  also consisted of discussions dealing with the complex and emotionally intense issues of symptom management and palliative care in the setting of serious and potentially life-threatening illness. Palliative care team will continue to support patient, patient's family, and medical team.  Discussed with family the importance of continued conversation with each other and the medical providers regarding overall plan of care and treatment options, ensuring decisions are within the context of the patient's values and GOCs.    Questions and concerns were addressed. The patient/family was encouraged to call with questions and/or concerns. PMT number was provided.    Primary Decision Maker: NEXT OF KIN - wife/Ann Hotz    SUMMARY OF RECOMMENDATIONS: . Initiated full comfort measures . Continue DNR/DNI as previously documented . Wife requesting transfer to [facility] notified and consult placed . Wife is not interested in pursuing feeding tube - family willing to accept risk of aspiration and recommend careful hand feeding . Added orders for EOL symptom management and to reflect full comfort measures, as  well as discontinued orders that were not focused on comfort . Unrestricted visitation orders were placed per current [facility] EOL visitation policy  . Provide frequent assessments and administer PRN medications as clinically necessary to ensure EOL comfort . PMT will continue to follow holistically  Code Status/Advance Care Planning:  DNR  Palliative Prophylaxis:   Aspiration, Bowel Regimen, Delirium Protocol, Frequent Pain Assessment, Oral Care and Turn Reposition  Additional Recommendations (Limitations, Scope, Preferences):  Full Comfort Care  Psycho-social/Spiritual:  Created space and opportunity for patient and family to express thoughts and feelings regarding patient's current medical situation.   Emotional support and therapeutic listening  provided.  Prognosis:   < 2 weeks  Discharge Planning: Hospice facility      Primary Diagnoses: Present on Admission: **None**   I have reviewed the medical record, interviewed the patient and family, and examined the patient. The following aspects are pertinent.  Past Medical History:  Diagnosis Date  . Arthritis   . CAD (coronary artery disease) 09/18/2010   Myoview 8/20: EF 62, mild apical thinning, no ischemia, low risk study  . Chest pain   . Colon polyp   . Coronary artery disease    post PTCA and stent of the distal RCA. He has a 3.0 mm espress 2 stent deployed at 14 atmospheres placed in [place], [place]            . Dyslipidemia   . Erectile dysfunction   . GERD (gastroesophageal reflux disease)   . Headache   . Hyperlipidemia   . Hypertension   . Kidney stones    history   . Memory loss   . Skin cancer 2015   Social History   Socioeconomic History  . Marital status: Married    Spouse name: Not on file  . Number of children: Not on file  . Years of education: Not on file  . Highest education level: Not on file  Occupational History  . Not on file  Tobacco Use  . Smoking status: Never Smoker  . Smokeless tobacco: Former Systems developer    Types: Secondary school teacher  . Vaping Use: Never used  Substance and Sexual Activity  . Alcohol use: Yes    Comment: 0.6 oz per week   . Drug use: No  . Sexual activity: Not on file  Other Topics Concern  . Not on file  Social History Narrative   Right handed      Highest level of edu- 12 grade      Lives with wife in single level home   Social Determinants of Health   Financial Resource Strain: Not on file  Food Insecurity: Not on file  Transportation Needs: Not on file  Physical Activity: Not on file  Stress: Not on file  Social Connections: Not on file   Family History  Problem Relation Age of Onset  . Aortic aneurysm Father   . Cancer Father        colon   . Coronary artery disease Mother        with CABG   . Alzheimer's disease Mother   . Hypertension Brother    Scheduled Meds: . ALPRAZolam  0.5 mg Oral Q6H  . QUEtiapine  25 mg Oral Daily  . QUEtiapine  50 mg Oral QHS   Continuous Infusions: PRN Meds:.haloperidol Medications Prior to Admission:  Prior to Admission medications   Medication Sig Start Date End Date Taking? Authorizing Provider  acetaminophen (TYLENOL) 500 MG tablet Take  500 mg by mouth daily as needed for headache (pain).   Yes [provider]  ALPRAZolam Duanne Moron) 0.5 MG tablet Take 0.5 mg by mouth See admin instructions. Take one tablet (0.5 mg) by mouth every 5-6 hours while awake 12/26/19  Yes [provider]  nitroGLYCERIN (NITROSTAT) 0.4 MG SL tablet Place 1 tablet (0.4 mg total) under the tongue every 5 (five) minutes as needed for chest pain. 09/23/18 04/11/19 Yes Nahser, Wonda Cheng, MD  QUEtiapine (SEROQUEL) 25 MG tablet Take 25 mg by mouth 2 (two) times daily. 01/04/20  Yes [provider]   Allergies  Allergen Reactions  . Oxycodone-Acetaminophen Other (See Comments)    Pt saw bugs   Review of Systems  Unable to perform ROS: Acuity of condition    Physical Exam Vitals and nursing note reviewed.  Constitutional:      General: He is not in acute distress.    Appearance: He is ill-appearing.     Comments: Frail appearing  Pulmonary:     Effort: No respiratory distress.  Skin:    General: Skin is warm and dry.  Neurological:     Mental Status: He is unresponsive.     Motor: Weakness present.  Psychiatric:        Speech: He is noncommunicative.     Vital Signs: BP (!) 133/97 (BP Location: Right Arm)   Pulse 99   Temp 99.1 F (37.3 C) (Oral)   Resp 18   SpO2 (!) 84%  Pain Scale: 0-10   Pain Score: 0-No pain   SpO2: SpO2: (!) 84 % O2 Device:SpO2: (!) 84 % O2 Flow Rate: .   IO: Intake/output summary: No intake or output data in the 24 hours ending 02/06/20 1546  LBM:   Baseline Weight:   Most recent weight:        Palliative Assessment/Data: PPS 10%     Time In: 1600 Time Out: 1712 Time Total: 72 minutes  Greater than 50%  of this time was spent counseling and coordinating care related to the above assessment and plan.  Signed by: Lin Landsman, NP   Please contact Palliative Medicine Team phone at [phone] for questions and concerns.  For individual provider: See Shea Evans

## 2020-02-06 NOTE — ED Notes (Signed)
Pt transferred from purple rm 49 to yellow rm 41

## 2020-02-06 NOTE — Evaluation (Signed)
Clinical/Bedside Swallow Evaluation Patient Details  Name: [name] MRN: [id number] Date of Birth: [birth year]  Today's Date: 02/06/2020 Time: SLP Start Time (ACUTE ONLY): 7062 SLP Stop Time (ACUTE ONLY): 1259 SLP Time Calculation (min) (ACUTE ONLY): 14 min  Past Medical History:  Past Medical History:  Diagnosis Date  . Arthritis   . CAD (coronary artery disease) 09/18/2010   Myoview 8/20: EF 62, mild apical thinning, no ischemia, low risk study  . Chest pain   . Colon polyp   . Coronary artery disease    post PTCA and stent of the distal RCA. He has a 3.0 mm espress 2 stent deployed at 14 atmospheres placed in [place], [place]            . Dyslipidemia   . Erectile dysfunction   . GERD (gastroesophageal reflux disease)   . Headache   . Hyperlipidemia   . Hypertension   . Kidney stones    history   . Memory loss   . Skin cancer 2015   Past Surgical History:  Past Surgical History:  Procedure Laterality Date  . BACK SURGERY    . CAROTID ENDARTERECTOMY    . COLONOSCOPY    . CORONARY ANGIOPLASTY WITH STENT PLACEMENT     post PTCA and stent of the distal RCA. He has a 3.0 mm espress 2 stent deployed at 14 atmospheres placed in [place], [place]  06/2014  . EYE SURGERY    . HERNIA REPAIR    . JOINT REPLACEMENT     left knee  . POLYPECTOMY    . TOTAL KNEE ARTHROPLASTY     left   HPI:  Pt is 75 yo male with hx of GERD, hyperlipidemia, HTN, and dementia.  He was brought to ED by wife due to increased agitation and aggressiveness with hallucinations.  Pt is awaiting geri psych placement. Has been in the ED since 12/26. Started demonstrating poor secretion management on 1/17.   Assessment / Plan / Recommendation Clinical Impression  Pt presents with overt signs of aspiration, At baseline pt has congested cough and breathing and vocal quality is wet suggesting pooled pharyngeal secretions. Though he is unable to follow most commands, he  does respond to spoon presentations of ice and PO with oral acceptance and manipulation. Swallow is elicited but is audible and is followed by wet cough. Strongly suspect pt is not initiating swallows to manage secretions and pooled secretions are aspirated with trials of PO. Pt appears sedated and minimally responsive. Anticipate that swallow ability would improve if arousal were better as pt has been admitted for several weeks and only recently showed signs of aspiration. Will f/u for trials, for now pt needs a Cortrak. SLP Visit Diagnosis: Dysphagia, oropharyngeal phase (R13.12)    Aspiration Risk  Severe aspiration risk;Risk for inadequate nutrition/hydration    Diet Recommendation NPO   Medication Administration: Via alternative means    Other  Recommendations Oral Care Recommendations: Oral care QID Other Recommendations: Have oral suction available   Follow up Recommendations Skilled Nursing facility      Frequency and Duration min 2x/week  2 weeks       Prognosis Prognosis for Safe Diet Advancement: Fair Barriers to Reach Goals: Severity of deficits      Swallow Study   General HPI: Pt is 75 yo male with hx of GERD, hyperlipidemia, HTN, and dementia.  He was brought to  ED by wife due to increased agitation and aggressiveness with hallucinations.  Pt is awaiting geri psych placement. Has been in the ED since 12/26. Started demonstrating poor secretion management on 1/17. Type of Study: Bedside Swallow Evaluation Previous Swallow Assessment: none Diet Prior to this Study: NPO Temperature Spikes Noted: No Respiratory Status: Room air History of Recent Intubation: No Behavior/Cognition: Lethargic/Drowsy;Requires cueing Oral Care Completed by SLP: No Oral Cavity - Dentition: Adequate natural dentition Vision: Impaired for self-feeding Self-Feeding Abilities: Total assist Patient Positioning: Upright in bed Baseline Vocal Quality: Low vocal intensity;Wet Volitional Cough:  Congested;Weak Volitional Swallow: Unable to elicit    Oral/Motor/Sensory Function Overall Oral Motor/Sensory Function: Generalized oral weakness   Ice Chips Ice chips: Impaired Presentation: Spoon Pharyngeal Phase Impairments: Suspected delayed Swallow;Wet Vocal Quality;Multiple swallows;Cough - Delayed   Thin Liquid Thin Liquid: Not tested    Nectar Thick Nectar Thick Liquid: Not tested   Honey Thick Honey Thick Liquid: Not tested   Puree Puree: Impaired Presentation: Spoon Pharyngeal Phase Impairments: Suspected delayed Swallow;Multiple swallows;Wet Vocal Quality;Cough - Immediate   Solid     Solid: Not tested     Herbie Baltimore, MA CCC-SLP  Acute Rehabilitation Services Pager [phone] Office [phone]  Lynann Beaver 02/06/2020,1:10 PM

## 2020-02-06 NOTE — Discharge Planning (Signed)
Dajanee Voorheis J. Clydene Laming, RN, BSN, [place] [phone] Palliative NP spoke with pt spouse via telephone regarding discharge planning for [facility]. Offered list of home health hospice agencies to choose from.  Pt chose [facility] to render services. Chrislyn Edison Pace, [place] [facility] notified. Patient spouse made aware that [facility] will be in contact.

## 2020-02-06 NOTE — ED Provider Notes (Addendum)
Patient is awaiting placement.  Notified by nursing staff that he has had a productive cough with gurgling sounding when coughing.  Patient was suctioned, chest x-ray was ordered along with swallow test for further evaluation.  Patient is sitting up, no respiratory distress.  During my shift the patient became hypoxic to the high 80s on room air.  He is sitting up, eyes open, alert.  He has scattered rales at the bases with audible upper airway secretions.  Nasal cannula placed, he responded nicely. Speech evaluated the patient and he failed a swallow study, he is now n.p.o.  Concern for possible aspiration.  We repeated his blood work and COVID swab and he is now COVID-positive.  Patient will require admission for hypoxia secondary to COVID.   Lorelle Gibbs, DO 02/06/20 9833    Lorelle Gibbs, DO 02/06/20 1249    Hanin Decook, Alvin Critchley, DO 02/06/20 1657

## 2020-02-06 NOTE — ED Notes (Signed)
Patient calm and cooperative with morning vital signs, patient found to be soiled in urine and smear of stool. Brief and draw sheet changed. Patient appears to be comfortable in bed and shows no signs of pain. Patient took morning medication in applesauce.

## 2020-02-06 NOTE — Progress Notes (Signed)
CSW followed up with [facility], however CSW had to leave voicemail for [name] at this time requesting call back. CSW to follow up with [facility] Supervisor as instructed in last CSW note.   [name], MSW, LCSW [facility] [place] [facility] [place] [phone]

## 2020-02-06 NOTE — ED Notes (Signed)
Pt placed on O2 at 5L/M/[place] -- sats increased to 92%

## 2020-02-07 DIAGNOSIS — Z515 Encounter for palliative care: Secondary | ICD-10-CM

## 2020-02-07 MED ORDER — IPRATROPIUM-ALBUTEROL 20-100 MCG/ACT IN AERS: 1.0000 | INHALATION_SPRAY | RESPIRATORY_TRACT | Status: AC | PRN

## 2020-02-07 MED ORDER — ONDANSETRON HCL 4 MG PO TABS: 4.0000 mg | ORAL_TABLET | Freq: Four times a day (QID) | ORAL | 0 refills | Status: AC | PRN

## 2020-02-07 MED ORDER — QUETIAPINE FUMARATE 50 MG PO TABS: 50.0000 mg | ORAL_TABLET | Freq: Every day | ORAL | Status: AC

## 2020-02-07 MED ORDER — MORPHINE SULFATE (CONCENTRATE) 10 MG/0.5ML PO SOLN: 5.0000 mg | ORAL | Status: DC | PRN

## 2020-02-07 MED ORDER — ONDANSETRON HCL 4 MG/2ML IJ SOLN: 4.0000 mg | Freq: Four times a day (QID) | INTRAMUSCULAR | 0 refills | Status: AC | PRN

## 2020-02-07 MED ORDER — QUETIAPINE FUMARATE 25 MG PO TABS: 25.0000 mg | ORAL_TABLET | Freq: Every day | ORAL | Status: AC

## 2020-02-07 MED ORDER — POLYVINYL ALCOHOL 1.4 % OP SOLN: 1.0000 [drp] | Freq: Four times a day (QID) | OPHTHALMIC | 0 refills | Status: AC | PRN

## 2020-02-07 MED ORDER — GUAIFENESIN-DM 100-10 MG/5ML PO SYRP: 10.0000 mL | ORAL_SOLUTION | ORAL | 0 refills | Status: AC | PRN

## 2020-02-07 MED ORDER — HALOPERIDOL 2 MG PO TABS: 2.0000 mg | ORAL_TABLET | Freq: Four times a day (QID) | ORAL | Status: AC | PRN

## 2020-02-07 MED ORDER — ACETAMINOPHEN 325 MG PO TABS: 650.0000 mg | ORAL_TABLET | Freq: Four times a day (QID) | ORAL | Status: AC | PRN

## 2020-02-07 MED ORDER — GLYCOPYRROLATE 1 MG PO TABS: 1.0000 mg | ORAL_TABLET | ORAL | Status: AC | PRN

## 2020-02-07 MED ORDER — HALOPERIDOL LACTATE 5 MG/ML IJ SOLN: 2.0000 mg | Freq: Four times a day (QID) | INTRAMUSCULAR | Status: AC | PRN

## 2020-02-07 MED ORDER — GLYCOPYRROLATE 0.2 MG/ML IJ SOLN: 0.2000 mg | INTRAMUSCULAR | Status: AC | PRN

## 2020-02-07 MED ORDER — HALOPERIDOL LACTATE 2 MG/ML PO CONC: 2.0000 mg | Freq: Four times a day (QID) | ORAL | 0 refills | Status: AC | PRN

## 2020-02-07 MED ORDER — LORAZEPAM 2 MG/ML IJ SOLN: 1.0000 mg | INTRAMUSCULAR | 0 refills | Status: AC | PRN

## 2020-02-07 MED ORDER — ACETAMINOPHEN 650 MG RE SUPP
650.0000 mg | Freq: Four times a day (QID) | RECTAL | 0 refills | Status: AC | PRN
Start: 2020-02-07 — End: ?

## 2020-02-07 MED ORDER — LORAZEPAM 2 MG/ML PO CONC: 1.0000 mg | ORAL | 0 refills | Status: AC | PRN

## 2020-02-07 MED ORDER — MORPHINE SULFATE 10 MG/5ML PO SOLN: 5.0000 mg | ORAL | 0 refills | Status: AC | PRN

## 2020-02-07 NOTE — Discharge Summary (Signed)
[facility]  Physician Discharge Summary   Patient ID: [name] MRN: [id number] DOB/AGE: 1945-10-20 75 y.o.  Admit date: 01/14/2020 Discharge date: 02/07/2020  PCP: Chesley Noon, MD  DISCHARGE DIAGNOSES:  Acute respiratory failure with hypoxia Aspiration pneumonia COVID-19 infection Alzheimer's dementia History of coronary artery disease   RECOMMENDATIONS FOR OUTPATIENT FOLLOW UP: 1. Patient being discharged to residential hospice   CODE STATUS: DNR  DISCHARGE CONDITION: poor  Diet recommendation: Feeds as tolerated  INITIAL HISTORY: [name] is a 75 y.o. male with medical history significant of Alzheimer's dementia, CAD, HTN, HLD, developed vomiting and hypoxia.  Patient came to ED 01/14/2020 for evaluation of increasing confusion and agitation at home. ER staff and case management have been working on patient case and plan to discharge patient to geriatric psych facility. Patient has been staying in the ED since 01/14/2020. He was started 3 times for COVID-19 and negative on 12/26, 01/04 and 01/10.  Last night, patient became more agitated and vomited x1 and became hypoxic this morning and continued to have gurgling sound and coughing. Patient was suctioned and stat x-ray did not show significant infiltrate however patient then developed hypoxia was found O2 sat ration in the upper 80s. Then patient COVID test came back positive  Consultations:  Palliative care  Procedures:  None   HOSPITAL COURSE:    Acute hypoxic respiratory failure/aspiration pneumonia -Likely had aspiration event, now suspect aspiration pneumonitis/early aspiration pneumonia.  -Palliative care was consulted in the ED, who came down and hold a discussion with patient's wife and after thoroughly evaluating on patient's conditiosn especially the seemingly increased risk of recurrent aspiration events given his worsening of mentation, and on the other hand, alternative  treatment option such as feeding tube is not desired by patient family, family decided to change goals of care and pursue comfort measures only. -COVID-19 pneumonia less likely.  Alzheimer's dementia/HTN/HLD/CAD -No further treatment  Obesity Estimated body mass index is 31.46 kg/m as calculated from the following:   Height as of 04/11/19: 6' (1.829 m).   Weight as of 04/11/19: 105.2 kg.   Patient to be discharged to residential hospice.   PERTINENT LABS:  The results of significant diagnostics from this hospitalization (including imaging, microbiology, ancillary and laboratory) are listed below for reference.    Microbiology: Recent Results (from the past 240 hour(s))  SARS CORONAVIRUS 2 (TAT 6-24 HRS) Nasopharyngeal Nasopharyngeal Swab     Status: None   Collection Time: 01/29/20 12:35 PM   Specimen: Nasopharyngeal Swab  Result Value Ref Range Status   SARS Coronavirus 2 NEGATIVE NEGATIVE Final    Comment: (NOTE) SARS-CoV-2 target nucleic acids are NOT DETECTED.  The SARS-CoV-2 RNA is generally detectable in upper and lower respiratory specimens during the acute phase of infection. Negative results do not preclude SARS-CoV-2 infection, do not rule out co-infections with other pathogens, and should not be used as the sole basis for treatment or other patient management decisions. Negative results must be combined with clinical observations, patient history, and epidemiological information. The expected result is Negative.  Fact Sheet for Patients: SugarRoll.be  Fact Sheet for Healthcare Providers: https://www.woods-mathews.com/  This test is not yet approved or cleared by the [place] FDA and  has been authorized for detection and/or diagnosis of SARS-CoV-2 by FDA under an Emergency Use Authorization (EUA). This EUA will remain  in effect (meaning this test can be used) for the duration of the COVID-19 declaration under Se  ction 564(b)(1) of the  Act, 21 U.S.C. section 360bbb-3(b)(1), unless the authorization is terminated or revoked sooner.  Performed at [facility], [place] [address], [place], [place] [address]   Resp panel by RT-PCR (RSV, Flu A&B, Covid) Nasopharyngeal Swab     Status: Abnormal   Collection Time: 02/06/20  2:03 PM   Specimen: Nasopharyngeal Swab; Nasopharyngeal(NP) swabs in vial transport medium  Result Value Ref Range Status   SARS Coronavirus 2 by RT PCR POSITIVE (A) NEGATIVE Final    Comment: emailed L. Berdik RN 15:05 02/06/20 ([email or web address]) (NOTE) SARS-CoV-2 target nucleic acids are DETECTED.  The SARS-CoV-2 RNA is generally detectable in upper respiratory specimens during the acute phase of infection. Positive results are indicative of the presence of the identified virus, but do not rule out bacterial infection or co-infection with other pathogens not detected by the test. Clinical correlation with patient history and other diagnostic information is necessary to determine patient infection status. The expected result is Negative.  Fact Sheet for Patients: EntrepreneurPulse.com.au  Fact Sheet for Healthcare Providers: IncredibleEmployment.be  This test is not yet approved or cleared by the [place] FDA and  has been authorized for detection and/or diagnosis of SARS-CoV-2 by FDA under an Emergency Use Authorization (EUA).  This EUA will remain in effect (meaning this test can be used) for the duration of  the COVID-1 9 declaration under Section 564(b)(1) of the Act, 21 U.S.C. section 360bbb-3(b)(1), unless the authorization is terminated or revoked sooner.     Influenza A by PCR NEGATIVE NEGATIVE Final   Influenza B by PCR NEGATIVE NEGATIVE Final    Comment: (NOTE) The Xpert Xpress SARS-CoV-2/FLU/RSV plus assay is intended as an aid in the diagnosis of influenza from Nasopharyngeal swab specimens and should not be used as a sole  basis for treatment. Nasal washings and aspirates are unacceptable for Xpert Xpress SARS-CoV-2/FLU/RSV testing.  Fact Sheet for Patients: EntrepreneurPulse.com.au  Fact Sheet for Healthcare Providers: IncredibleEmployment.be  This test is not yet approved or cleared by the [place] FDA and has been authorized for detection and/or diagnosis of SARS-CoV-2 by FDA under an Emergency Use Authorization (EUA). This EUA will remain in effect (meaning this test can be used) for the duration of the COVID-19 declaration under Section 564(b)(1) of the Act, 21 U.S.C. section 360bbb-3(b)(1), unless the authorization is terminated or revoked.     Resp Syncytial Virus by PCR NEGATIVE NEGATIVE Final    Comment: (NOTE) Fact Sheet for Patients: EntrepreneurPulse.com.au  Fact Sheet for Healthcare Providers: IncredibleEmployment.be  This test is not yet approved or cleared by the [place] FDA and has been authorized for detection and/or diagnosis of SARS-CoV-2 by FDA under an Emergency Use Authorization (EUA). This EUA will remain in effect (meaning this test can be used) for the duration of the COVID-19 declaration under Section 564(b)(1) of the Act, 21 U.S.C. section 360bbb-3(b)(1), unless the authorization is terminated or revoked.  Performed at [facility], [place] [address], [place], [place] [address]      Labs:  COVID-19 Labs  No results for input(s): DDIMER, FERRITIN, LDH, CRP in the last 72 hours.  Lab Results  Component Value Date   SARSCOV2NAA POSITIVE (A) 02/06/2020   [place] NEGATIVE 01/29/2020   [place] NEGATIVE 01/14/2020   [place] NEGATIVE 08/11/2018      Basic Metabolic Panel: Recent Labs  Lab 02/06/20 1303 02/06/20 1447  NA 139 141  K 4.1 4.0  CL 100 100  CO2 27  --   GLUCOSE 122*  120*  BUN 17 20  CREATININE 1.31* 1.20  CALCIUM 9.2  --    Liver Function  Tests: No results for input(s): AST, ALT, ALKPHOS, BILITOT, PROT, ALBUMIN in the last 168 hours. No results for input(s): LIPASE, AMYLASE in the last 168 hours. No results for input(s): AMMONIA in the last 168 hours. CBC: Recent Labs  Lab 02/06/20 1303 02/06/20 1447  WBC 10.3  --   NEUTROABS 8.3*  --   HGB 15.8 16.0  HCT 46.2 47.0  MCV 93.1  --   PLT 245  --    Cardiac Enzymes: No results for input(s): CKTOTAL, CKMB, CKMBINDEX, TROPONINI in the last 168 hours. BNP: BNP (last 3 results) No results for input(s): BNP in the last 8760 hours.  ProBNP (last 3 results) No results for input(s): PROBNP in the last 8760 hours.  CBG: No results for input(s): GLUCAP in the last 168 hours.   IMAGING STUDIES DG Chest 1 View  Result Date: 02/06/2020 CLINICAL DATA:  Hypoxia EXAM: CHEST  1 VIEW COMPARISON:  02/06/2020, 01/14/2020 FINDINGS: Post sternotomy changes. The right lung is grossly clear. Increasing atelectasis or mild infiltrate left base. Stable cardiomediastinal silhouette. No pneumothorax. Aortic atherosclerosis. IMPRESSION: Increasing atelectasis or mild infiltrate left base. Electronically Signed   By: Donavan Foil M.D.   On: 02/06/2020 18:03   DG Chest Port 1 View  Result Date: 02/06/2020 CLINICAL DATA:  Cough.  Hypertension. EXAM: PORTABLE CHEST 1 VIEW COMPARISON:  01/14/2020 FINDINGS: Reverse apical lordotic positioning. Prior median sternotomy. Midline trachea. Normal heart size. Mild left hemidiaphragm elevation. No pleural effusion or pneumothorax. Mild left base subsegmental atelectasis or scar. Clear right lung. IMPRESSION: No acute cardiopulmonary disease. Electronically Signed   By: Abigail Miyamoto M.D.   On: 02/06/2020 09:34      DISCHARGE EXAMINATION: Vitals:   02/06/20 1220 02/06/20 1830 02/06/20 1850 02/06/20 2113  BP: (!) 133/97 (!) 138/94  (!) 135/91  Pulse: 99 (!) 132  (!) 105  Resp: 18 (!) 30  20  Temp: 99.1 F (37.3 C) (!) 102.6 F (39.2 C) (!) 103.8 F  (39.9 C) 99.5 F (37.5 C)  TempSrc: Oral Axillary Rectal Axillary  SpO2: (!) 84% 96%  91%   General appearance: No distress.  Confused and distracted.  Poorly responsive Resp: Mildly tachypneic. Cardio: S1-S2 is normal regular.  No S3-S4.  No rubs murmurs or bruit GI: Abdomen is soft.  Nontender nondistended.  Bowel sounds are present normal.  No masses organomegaly    DISPOSITION: Residential hospice       Allergies as of 02/07/2020      Reactions   Oxycodone-acetaminophen Other (See Comments)   Pt saw bugs      Medication List    STOP taking these medications   ALPRAZolam 0.5 MG tablet Commonly known as: XANAX   nitroGLYCERIN 0.4 MG SL tablet Commonly known as: NITROSTAT     TAKE these medications   acetaminophen 325 MG tablet Commonly known as: TYLENOL Take 2 tablets (650 mg total) by mouth every 6 (six) hours as needed for mild pain or headache (fever >/= 101). What changed:   medication strength  how much to take  when to take this  reasons to take this   acetaminophen 650 MG suppository Commonly known as: TYLENOL Place 1 suppository (650 mg total) rectally every 6 (six) hours as needed for mild pain (or Fever >/= 101). What changed: You were already taking a medication with the same name, and this prescription  was added. Make sure you understand how and when to take each.   glycopyrrolate 0.2 MG/ML injection Commonly known as: ROBINUL Inject 1 mL (0.2 mg total) into the vein every 4 (four) hours as needed (excessive secretions).   glycopyrrolate 0.2 MG/ML injection Commonly known as: ROBINUL Inject 1 mL (0.2 mg total) into the skin every 4 (four) hours as needed (excessive secretions).   glycopyrrolate 1 MG tablet Commonly known as: ROBINUL Take 1 tablet (1 mg total) by mouth every 4 (four) hours as needed (excessive secretions).   guaiFENesin-dextromethorphan 100-10 MG/5ML syrup Commonly known as: ROBITUSSIN DM Take 10 mLs by mouth every 4  (four) hours as needed for cough.   haloperidol 2 MG tablet Commonly known as: HALDOL Take 1 tablet (2 mg total) by mouth every 6 (six) hours as needed for agitation (or delirium).   haloperidol 2 MG/ML solution Commonly known as: HALDOL Place 1 mL (2 mg total) under the tongue every 6 (six) hours as needed for agitation (or delirium).   haloperidol lactate 5 MG/ML injection Commonly known as: HALDOL Inject 0.4 mLs (2 mg total) into the vein every 6 (six) hours as needed (or delirium).   Ipratropium-Albuterol 20-100 MCG/ACT Aers respimat Commonly known as: COMBIVENT Inhale 1 puff into the lungs every 4 (four) hours as needed for wheezing.   LORazepam 2 MG/ML concentrated solution Commonly known as: ATIVAN Place 0.5 mLs (1 mg total) under the tongue every hour as needed for anxiety, seizure, sedation or sleep.   LORazepam 2 MG/ML injection Commonly known as: ATIVAN Inject 0.5 mLs (1 mg total) into the vein every hour as needed for anxiety, seizure or sedation.   morphine 10 MG/5ML solution Take 2.5-5 mLs (5-10 mg total) by mouth every 2 (two) hours as needed for severe pain or moderate pain (dyspnea, increased work of breathing, respiratory rate >25).   ondansetron 4 MG tablet Commonly known as: ZOFRAN Take 1 tablet (4 mg total) by mouth every 6 (six) hours as needed for nausea.   ondansetron 4 MG/2ML Soln injection Commonly known as: ZOFRAN Inject 2 mLs (4 mg total) into the vein every 6 (six) hours as needed for nausea.   polyvinyl alcohol 1.4 % ophthalmic solution Commonly known as: LIQUIFILM TEARS Place 1 drop into both eyes 4 (four) times daily as needed for dry eyes.   QUEtiapine 50 MG tablet Commonly known as: SEROQUEL Take 1 tablet (50 mg total) by mouth at bedtime. What changed:   medication strength  how much to take  when to take this   QUEtiapine 25 MG tablet Commonly known as: SEROQUEL Take 1 tablet (25 mg total) by mouth daily. What changed: You  were already taking a medication with the same name, and this prescription was added. Make sure you understand how and when to take each.          TOTAL DISCHARGE TIME: 35 minutes  Jihan Mellette [employer] on [email or web address]  02/07/2020, 2:19 PM

## 2020-02-07 NOTE — TOC Progression Note (Addendum)
Transition of Care [facility]) - Progression Note    Patient Details  Name: FINIAN HELVEY MRN: [id number] Date of Birth: 07-14-1945  [facility] CM/SW Contact  Jacalyn Lefevre Edson Snowball, RN Phone Number: 02/07/2020, 11:18 AM  Clinical Narrative:     Referral for residential hospice.   [facility] in [place] do not accept covid positive patient's  [facility] , their location in [place] does not accept covid positive patient's , however their [place] location ( [address] does.   NCM called patient's wife Lelon Frohlich. Explained above. Ann lives on [place] side of [place] and she only wants what is best for [place], she is in agreement for [facility]. Referral made.    1430 patient can go to [facility]) in [place]. He has been approved and they can offer a COVID bed today. Wife is aware and in agreement , nurse to  call report to [facility] at [phone]. PTAR called  Expected Discharge Plan: [place] Barriers to Discharge: Continued Medical Work up  Expected Discharge Plan and Services Expected Discharge Plan: [place]   Discharge Planning Services: CM Consult Post Acute Care Choice: Hospice Living arrangements for the past 2 months: Single Family Home                 DME Arranged: N/A         HH Arranged: NA           Social Determinants of Health (SDOH) Interventions    Readmission Risk Interventions No flowsheet data found.

## 2020-02-07 NOTE — Progress Notes (Signed)
Daily Progress Note   Patient Name: [name]       Date: 02/07/2020 DOB: [birth year]  Age: 75 y.o. MRN#: [id number] Attending Physician: Bonnielee Haff, MD Primary Care Physician: Chesley Noon, MD Admit Date: 01/14/2020  Reason for Consultation/Follow-up: Disposition, Non pain symptom management, Pain control, Psychosocial/spiritual support and Terminal Care  Subjective: Chart review performed. Received report from primary RN - no acute concerns. RN states patient has lost IV access. Ok to leave out IV - informed I could adjust/re-order EOL medications to be administered via other routes.   Went to visit patient at bedside - no family/visitors present. Patient was lying in bed. No signs or non-verbal gestures of pain or discomfort noted. No respiratory distress, increased work of breathing, or secretions noted.   Called wife/Ann to further discuss disposition. Emotional support was provided. Lelon Frohlich was saddened that the patient no long was able to go to [facility] due to his COVID+ result; however, she expressed understanding and was agreeable for patient to transfer to [facility] (as per LCSW they do accept COVID patient's). Informed Ann that per LCSW, there was a bed available, but we were waiting for patient to be approved - if he is approved patient will likely discharge today. Informed Ann that since the patient has been admitted and is out of the ED, the patient is allowed EOL visitation per current [facility] policy - education was provided and Lelon Frohlich was happy to hear this news. Ann expressed understanding and gratitude for call and continual updates.   All questions and concerns addressed. Encouraged to call with questions and/or concerns. PMT number previously  provided.  Discussed with RN that the goal would not be to wean oxygen before patient was transported to residential hospice.   Length of Stay: 1  Current Medications: Scheduled Meds:  . ALPRAZolam  0.5 mg Oral Q6H  . antiseptic oral rinse  15 mL Topical BID  . enoxaparin (LOVENOX) injection  40 mg Subcutaneous Q24H  . QUEtiapine  25 mg Oral Daily  . QUEtiapine  50 mg Oral QHS    Continuous Infusions:   PRN Meds: acetaminophen, acetaminophen, glycopyrrolate **OR** glycopyrrolate **OR** glycopyrrolate, guaiFENesin-dextromethorphan, haloperidol **OR** haloperidol **OR** haloperidol lactate, Ipratropium-Albuterol, LORazepam **OR** LORazepam, morphine, ondansetron **OR** ondansetron (ZOFRAN) IV, polyvinyl alcohol  Physical Exam Vitals and nursing  note reviewed.  Constitutional:      General: He is not in acute distress.    Appearance: He is ill-appearing.  Pulmonary:     Effort: No respiratory distress.  Skin:    General: Skin is cool and dry.  Neurological:     Mental Status: He is lethargic.     Motor: Weakness present.  Psychiatric:        Speech: He is noncommunicative.             Vital Signs: BP (!) 135/91 (BP Location: Right Arm)   Pulse (!) 105   Temp 99.5 F (37.5 C) (Axillary)   Resp 20   SpO2 91%  SpO2: SpO2: 91 % O2 Device: O2 Device: Nasal Cannula O2 Flow Rate: O2 Flow Rate (L/min): 7 L/min  Intake/output summary: No intake or output data in the 24 hours ending 02/07/20 1147 LBM: Last BM Date: 02/06/20 Baseline Weight:   Most recent weight:         Palliative Assessment/Data: PPS 10%    Flowsheet Rows   Flowsheet Row Most Recent Value  Intake Tab   Referral Department Hospitalist  Unit at Time of Referral ER  Palliative Care Primary Diagnosis Neurology  Date Notified 02/06/20  Palliative Care Type New Palliative care  Reason for referral Clarify Goals of Care  Date of Admission 01/14/20  Date first seen by Palliative Care 02/06/20  # of  days Palliative referral response time 0 Day(s)  # of days IP prior to Palliative referral 23  Clinical Assessment   Psychosocial & Spiritual Assessment   Palliative Care Outcomes   Patient/Family meeting held? Yes  Who was at the meeting? wife  Palliative Care Outcomes Improved pain interventions, Improved non-pain symptom therapy, Clarified goals of care, Counseled regarding hospice, Provided end of life care assistance, Provided psychosocial or spiritual support, Changed to focus on comfort, Changed CPR status, Transitioned to hospice  Patient/Family wishes: Interventions discontinued/not started  Mechanical Ventilation, Antibiotics, BiPAP, Tube feedings/TPN, Hemodialysis, NIPPV, Transfusion, Trach, Vasopressors, PEG, Transfer out of ICU      Patient Active Problem List   Diagnosis Date Noted  . Hypoxia 02/06/2020  . COVID-19 02/06/2020  . Aspiration into airway 02/06/2020  . 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 Syncope 08/11/2018  . Memory loss   . Pyrexia   . Chest pain 05/19/2015  . Headache 05/19/2015  . Pulsatile abdominal mass 05/17/2012  . Hyperlipidemia 11/09/2011  . Sinus bradycardia 11/09/2011  . HTN (hypertension) 09/18/2010  . CAD (coronary artery disease) 09/18/2010    Palliative Care Assessment & Plan   Patient Profile: 75 y.o. male  with past medical history of skin cancer, Alzheimer's dementia, CAD, HTN presented to the ED on 01/14/20 from home with family complaints of patient's increasing confusion and agitation at home. Plan was for patient to discharge to a geriatric psych facility but have been unable to find placement - patient has been staying in the ED since 01/14/20.  Assessment: Acute hypoxic respiratory failure Alzheimer's dementia HTN CAD Deconditioning Frailty Terminal care COVID19 infection  Recommendations/Plan:  Continue full comfort measures  Continue DNR/DNI as previously  documented  Transfer to [facility] when bed is available - they do accept COVID+ patients  Patient lost IV access - ok to leave out IV. EOL medications changed to alternate routes  Continue careful hand feeding - family accept risk for aspiration in context of comfort at EOL  Provide frequent assessments and administer  PRN medications as clinically necessary to ensure EOL comfort  PMT will continue to follow holistically  Goals of Care and Additional Recommendations:  Limitations on Scope of Treatment: Full Comfort Care  Code Status:    Code Status Orders  (From admission, onward)         Start     Ordered   02/06/20 1711  Do not attempt resuscitation (DNR)  Continuous       Question Answer Comment  In the event of cardiac or respiratory ARREST Do not call a "code blue"   In the event of cardiac or respiratory ARREST Do not perform Intubation, CPR, defibrillation or ACLS   In the event of cardiac or respiratory ARREST Use medication by any route, position, wound care, and other measures to relive pain and suffering. May use oxygen, suction and manual treatment of airway obstruction as needed for comfort.      02/06/20 1718        Code Status History    Date Active Date Inactive Code Status Order ID Comments User Context   02/06/2020 1702 02/06/2020 1718 DNR [id number]  Lequita Halt, MD ED   02/06/2020 1549 02/06/2020 1702 DNR [id number]  Lin Landsman, NP ED   01/14/2020 2111 02/06/2020 1549 Full Code [id number]  Lorayne Bender, PA-C ED   08/11/2018 2227 08/12/2018 1926 DNR [id number]  Bonnell Public, MD Inpatient   05/19/2015 2051 05/20/2015 1231 Full Code [id number]  Thurnell Lose, MD Inpatient   Advance Care Planning Activity       Prognosis:   < 2 weeks  Discharge Planning:  Hospice facility  Care plan was discussed with primary RN, TOC, patient's wife, Dr. [place] Pink  Thank you for allowing the Palliative Medicine Team to assist in the care of this  patient.   Total Time 25 minutes Prolonged Time Billed  no       Greater than 50%  of this time was spent counseling and coordinating care related to the above assessment and plan.  Lin Landsman, NP  Please contact Palliative Medicine Team phone at [phone] for questions and concerns.

## 2020-02-20 DEATH — deceased
